# Patient Record
Sex: Male | Born: 1980 | Race: Black or African American | Hispanic: No | Marital: Single | Smoking: Current every day smoker
Health system: Southern US, Community
[De-identification: ages and names within clinical notes are randomized; demographics above are authoritative.]

## PROBLEM LIST (undated history)

## (undated) DIAGNOSIS — F209 Schizophrenia, unspecified: Secondary | ICD-10-CM

---

## 2004-05-20 ENCOUNTER — Emergency Department (HOSPITAL_COMMUNITY): Admission: EM | Admit: 2004-05-20 | Discharge: 2004-05-20 | Payer: Self-pay | Admitting: Emergency Medicine

## 2004-08-25 ENCOUNTER — Emergency Department (HOSPITAL_COMMUNITY): Admission: EM | Admit: 2004-08-25 | Discharge: 2004-08-25 | Payer: Self-pay | Admitting: Emergency Medicine

## 2005-03-19 ENCOUNTER — Inpatient Hospital Stay (HOSPITAL_COMMUNITY): Admission: EM | Admit: 2005-03-19 | Discharge: 2005-03-20 | Payer: Self-pay | Admitting: Emergency Medicine

## 2007-11-21 ENCOUNTER — Emergency Department (HOSPITAL_COMMUNITY): Admission: EM | Admit: 2007-11-21 | Discharge: 2007-11-21 | Payer: Self-pay | Admitting: Emergency Medicine

## 2009-08-26 ENCOUNTER — Emergency Department (HOSPITAL_COMMUNITY): Admission: EM | Admit: 2009-08-26 | Discharge: 2009-08-26 | Payer: Self-pay | Admitting: Emergency Medicine

## 2009-08-26 ENCOUNTER — Ambulatory Visit: Payer: Self-pay | Admitting: Psychiatry

## 2009-08-26 ENCOUNTER — Inpatient Hospital Stay (HOSPITAL_COMMUNITY): Admission: EM | Admit: 2009-08-26 | Discharge: 2009-08-30 | Payer: Self-pay | Admitting: Psychiatry

## 2009-08-28 ENCOUNTER — Emergency Department (HOSPITAL_COMMUNITY): Admission: EM | Admit: 2009-08-28 | Discharge: 2009-08-29 | Payer: Self-pay | Admitting: Emergency Medicine

## 2009-09-16 ENCOUNTER — Ambulatory Visit: Payer: Self-pay | Admitting: Psychiatry

## 2009-09-16 ENCOUNTER — Inpatient Hospital Stay (HOSPITAL_COMMUNITY): Admission: RE | Admit: 2009-09-16 | Discharge: 2009-09-19 | Payer: Self-pay | Admitting: Psychiatry

## 2010-08-18 LAB — THC (MARIJUANA), URINE, CONFIRMATION: Marijuana, Ur-Confirmation: 390 NG/ML — ABNORMAL HIGH

## 2010-08-18 LAB — URINE DRUGS OF ABUSE SCREEN W ALC, ROUTINE (REF LAB)
Amphetamine Screen, Ur: NEGATIVE
Barbiturate Quant, Ur: NEGATIVE
Benzodiazepines.: NEGATIVE
Cocaine Metabolites: POSITIVE — AB
Creatinine,U: 152.6 mg/dL
Ethyl Alcohol: <10 mg/dL (ref <10)
Marijuana Metabolite: POSITIVE — AB
Methadone: NEGATIVE
Opiate Screen, Urine: NEGATIVE
Phencyclidine (PCP): NEGATIVE
Propoxyphene: NEGATIVE

## 2010-08-18 LAB — COCAINE, URINE, CONFIRMATION: Benzoylecgonine GC/MS Conf: 903 NG/ML — ABNORMAL HIGH

## 2010-08-23 LAB — RAPID URINE DRUG SCREEN, HOSP PERFORMED
Amphetamines: NOT DETECTED
Barbiturates: NOT DETECTED
Benzodiazepines: NOT DETECTED
Cocaine: POSITIVE — AB
Opiates: NOT DETECTED
Tetrahydrocannabinol: POSITIVE — AB

## 2010-08-23 LAB — URINALYSIS, ROUTINE W REFLEX MICROSCOPIC
Bilirubin Urine: NEGATIVE
Glucose, UA: NEGATIVE mg/dL
Hgb urine dipstick: NEGATIVE
Ketones, ur: NEGATIVE mg/dL
Nitrite: NEGATIVE
Protein, ur: NEGATIVE mg/dL
Specific Gravity, Urine: 1.026 (ref 1.005–1.030)
Urobilinogen, UA: 1 mg/dL (ref 0.0–1.0)
pH: 7 (ref 5.0–8.0)

## 2010-08-23 LAB — BASIC METABOLIC PANEL
BUN: 11 mg/dL (ref 6–23)
CO2: 29 mEq/L (ref 19–32)
Calcium: 9.4 mg/dL (ref 8.4–10.5)
Chloride: 106 mEq/L (ref 96–112)
Creatinine, Ser: 1.27 mg/dL (ref 0.4–1.5)
GFR calc Af Amer: >60 mL/min (ref >60)
GFR calc non Af Amer: >60 mL/min (ref >60)
Glucose, Bld: 72 mg/dL (ref 70–99)
Potassium: 4.3 mEq/L (ref 3.5–5.1)
Sodium: 140 mEq/L (ref 135–145)

## 2010-08-23 LAB — CBC
HCT: 47.9 % (ref 39.0–52.0)
Hemoglobin: 16.1 g/dL (ref 13.0–17.0)
MCHC: 33.5 g/dL (ref 30.0–36.0)
MCV: 91.3 fL (ref 78.0–100.0)
Platelets: 254 10*3/uL (ref 150–400)
RBC: 5.24 MIL/uL (ref 4.22–5.81)
RDW: 13.6 % (ref 11.5–15.5)
WBC: 9.8 10*3/uL (ref 4.0–10.5)

## 2010-08-23 LAB — ETHANOL: Alcohol, Ethyl (B): <5 mg/dL (ref 0–10)

## 2010-08-23 LAB — ACETAMINOPHEN LEVEL: Acetaminophen (Tylenol), Serum: <10 ug/mL — ABNORMAL LOW (ref 10–30)

## 2010-08-23 LAB — SALICYLATE LEVEL: Salicylate Lvl: <4 mg/dL (ref 2.8–20.0)
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
## 2012-10-19 ENCOUNTER — Emergency Department (HOSPITAL_COMMUNITY)
Admission: EM | Admit: 2012-10-19 | Discharge: 2012-10-19 | Disposition: A | Discharge status: Home / Self Care | Payer: Medicare Other | Attending: Emergency Medicine | Admitting: Emergency Medicine

## 2012-10-19 ENCOUNTER — Emergency Department (HOSPITAL_COMMUNITY): Payer: Medicare Other

## 2012-10-19 ENCOUNTER — Encounter (HOSPITAL_COMMUNITY): Payer: Self-pay | Admitting: Emergency Medicine

## 2012-10-19 DIAGNOSIS — N453 Epididymo-orchitis: Secondary | ICD-10-CM | POA: Insufficient documentation

## 2012-10-19 DIAGNOSIS — N451 Epididymitis: Secondary | ICD-10-CM

## 2012-10-19 LAB — URINE MICROSCOPIC-ADD ON

## 2012-10-19 LAB — URINALYSIS, ROUTINE W REFLEX MICROSCOPIC
Glucose, UA: NEGATIVE mg/dL
Hgb urine dipstick: NEGATIVE
Ketones, ur: NEGATIVE mg/dL
Nitrite: NEGATIVE
Protein, ur: 30 mg/dL — AB
Specific Gravity, Urine: 1.031 — ABNORMAL HIGH (ref 1.005–1.030)
Urobilinogen, UA: 1 mg/dL (ref 0.0–1.0)
pH: 5.5 (ref 5.0–8.0)

## 2012-10-19 MED ORDER — DEXTROSE 5 % IV SOLN: 250.0000 mg | Freq: Once | INTRAVENOUS | Status: DC

## 2012-10-19 MED ORDER — OXYCODONE-ACETAMINOPHEN 5-325 MG PO TABS
1.0000 | ORAL_TABLET | Freq: Once | ORAL | Status: AC
  Administered 2012-10-19: 1 via ORAL
  Filled 2012-10-19: qty 1

## 2012-10-19 MED ORDER — LIDOCAINE HCL 1 % IJ SOLN
INTRAMUSCULAR | Status: AC
  Administered 2012-10-19: 20 mL
  Filled 2012-10-19: qty 20

## 2012-10-19 MED ORDER — HYDROCODONE-ACETAMINOPHEN 5-325 MG PO TABS: 1.0000 | ORAL_TABLET | ORAL | Status: DC | PRN

## 2012-10-19 MED ORDER — DOXYCYCLINE HYCLATE 100 MG PO CAPS: 100.0000 mg | ORAL_CAPSULE | Freq: Two times a day (BID) | ORAL | Status: DC

## 2012-10-19 MED ORDER — CEFTRIAXONE SODIUM 250 MG IJ SOLR
250.0000 mg | Freq: Once | INTRAMUSCULAR | Status: AC
  Administered 2012-10-19: 250 mg via INTRAMUSCULAR
  Filled 2012-10-19: qty 250

## 2012-10-19 MED ORDER — DOXYCYCLINE HYCLATE 100 MG PO TABS
100.0000 mg | ORAL_TABLET | Freq: Once | ORAL | Status: AC
  Administered 2012-10-19: 100 mg via ORAL
  Filled 2012-10-19: qty 1

## 2012-10-19 NOTE — ED Provider Notes (Signed)
History     CSN: [id number]  Arrival date & time 10/19/12  1807   First MD Initiated Contact with Patient 10/19/12 1823      Chief Complaint  Patient presents with  . Testicle Pain    (Consider location/radiation/quality/duration/timing/severity/associated sxs/prior treatment) HPI Patient presents emergency department with left testicle pain.  Patient, states the pain started early this morning.  Patient, states, that he does not have any urethral discharge, fever, nausea, vomiting, abdominal pain, back pain, dysuria, or weakness.  Patient, states, that he did not take any medications prior to arrival for his symptoms.  Patient denies any blood in his semen.    No past medical history on file.  No past surgical history on file.  No family history on file.  History  Substance Use Topics  . Smoking status: Not on file  . Smokeless tobacco: Not on file  . Alcohol Use: Not on file      Review of Systems All other systems negative except as documented in the HPI. All pertinent positives and negatives as reviewed in the HPI. Allergies  Cogentin and Risperidone and related  Home Medications   Current Outpatient Rx  Name  Route  Sig  Dispense  Refill  . OLANZapine (ZYPREXA) 20 MG tablet   Oral   Take 20 mg by mouth at bedtime.           BP 92/43  Pulse 99  Temp(Src) 98.6 F (37 C) (Oral)  Resp 16  Ht 5\' 10"  (1.778 m)  Wt 170 lb (77.111 kg)  BMI 24.39 kg/m2  SpO2 99%  Physical Exam  Nursing note and vitals reviewed. Constitutional: He is oriented to person, place, and time. He appears well-developed and well-nourished. No distress.  HENT:  Head: Normocephalic and atraumatic.  Eyes: Pupils are equal, round, and reactive to light.  Cardiovascular: Normal rate and regular rhythm.   Pulmonary/Chest: Effort normal and breath sounds normal.  Abdominal: Soft. Bowel sounds are normal. He exhibits no distension. There is no tenderness. There is no guarding. Hernia  confirmed negative in the right inguinal area and confirmed negative in the left inguinal area.  Genitourinary: Penis normal.    Right testis shows no mass, no swelling and no tenderness. Right testis is descended. Left testis shows swelling and tenderness. Left testis shows no mass. Left testis is descended. Cremasteric reflex is not absent on the left side. Circumcised. No penile tenderness. No discharge found.  Lymphadenopathy:       Right: No inguinal adenopathy present.       Left: No inguinal adenopathy present.  Neurological: He is alert and oriented to person, place, and time.  Skin: Skin is warm and dry. No rash noted.    ED Course  Procedures (including critical care time)  Labs Reviewed  URINALYSIS, ROUTINE W REFLEX MICROSCOPIC   patient is awaiting ultrasound of the scrotum with arteriovenous flow   MDM         Carlyle Dolly, PA-C 10/19/12 1953

## 2012-10-19 NOTE — ED Notes (Addendum)
C/o pain to testicles since this am when he woke up.  States the "soreness" has worsened over the day.  States the pain is now in his abdomen as well.  States he had yellowish ejaculate yesterday and 2 days ago but otherwise no penile d/c.    Denies dysuria, fevers, or n/v.

## 2012-10-19 NOTE — ED Notes (Signed)
Patient transported to Ultrasound 

## 2012-10-19 NOTE — ED Provider Notes (Signed)
Pt received from [place], PA-C.  Pt presented to ED w/ L testicular pain.  US scrotum shows epididymitis.  Results discussed w/ patient.  Gc/Chlam culture obtained.  Pt received IM rocephin and first dose of doxy in ED and d/c'd home w/ doxy and vicodin.  Referred to urology.  Recommended jock strap and avoidance of heavy lifting.  Return precautions discussed. 8:59 PM   Otilio Miu, PA-C 10/19/12 2059

## 2012-10-20 LAB — GC/CHLAMYDIA PROBE AMP
CT Probe RNA: NEGATIVE
GC Probe RNA: NEGATIVE

## 2012-10-21 NOTE — ED Provider Notes (Signed)
Medical screening examination/treatment/procedure(s) were performed by non-physician practitioner and as supervising physician I was immediately available for consultation/collaboration.    Nelia Shi, MD 10/21/12 [phone]

## 2013-01-24 ENCOUNTER — Emergency Department (HOSPITAL_COMMUNITY): Payer: Medicare Other

## 2013-01-24 ENCOUNTER — Emergency Department (HOSPITAL_COMMUNITY)
Admission: EM | Admit: 2013-01-24 | Discharge: 2013-01-24 | Disposition: A | Discharge status: Home / Self Care | Payer: Medicare Other | Attending: Emergency Medicine | Admitting: Emergency Medicine

## 2013-01-24 ENCOUNTER — Encounter (HOSPITAL_COMMUNITY): Payer: Self-pay | Admitting: *Deleted

## 2013-01-24 DIAGNOSIS — S0181XA Laceration without foreign body of other part of head, initial encounter: Secondary | ICD-10-CM

## 2013-01-24 DIAGNOSIS — S022XXA Fracture of nasal bones, initial encounter for closed fracture: Secondary | ICD-10-CM | POA: Insufficient documentation

## 2013-01-24 DIAGNOSIS — S0990XA Unspecified injury of head, initial encounter: Secondary | ICD-10-CM | POA: Insufficient documentation

## 2013-01-24 DIAGNOSIS — S0180XA Unspecified open wound of other part of head, initial encounter: Secondary | ICD-10-CM | POA: Insufficient documentation

## 2013-01-24 DIAGNOSIS — H53149 Visual discomfort, unspecified: Secondary | ICD-10-CM | POA: Insufficient documentation

## 2013-01-24 DIAGNOSIS — S0993XA Unspecified injury of face, initial encounter: Secondary | ICD-10-CM | POA: Insufficient documentation

## 2013-01-24 DIAGNOSIS — S0510XA Contusion of eyeball and orbital tissues, unspecified eye, initial encounter: Secondary | ICD-10-CM | POA: Insufficient documentation

## 2013-01-24 DIAGNOSIS — H05231 Hemorrhage of right orbit: Secondary | ICD-10-CM

## 2013-01-24 DIAGNOSIS — Z23 Encounter for immunization: Secondary | ICD-10-CM | POA: Insufficient documentation

## 2013-01-24 DIAGNOSIS — Z79899 Other long term (current) drug therapy: Secondary | ICD-10-CM | POA: Insufficient documentation

## 2013-01-24 MED ORDER — TETANUS-DIPHTH-ACELL PERTUSSIS 5-2.5-18.5 LF-MCG/0.5 IM SUSP
0.5000 mL | Freq: Once | INTRAMUSCULAR | Status: AC
  Administered 2013-01-24: 0.5 mL via INTRAMUSCULAR
  Filled 2013-01-24: qty 0.5

## 2013-01-24 MED ORDER — ONDANSETRON 4 MG PO TBDP
4.0000 mg | ORAL_TABLET | Freq: Once | ORAL | Status: AC
  Administered 2013-01-24: 4 mg via ORAL
  Filled 2013-01-24: qty 1

## 2013-01-24 MED ORDER — OXYCODONE-ACETAMINOPHEN 5-325 MG PO TABS
1.0000 | ORAL_TABLET | Freq: Once | ORAL | Status: AC
  Administered 2013-01-24: 1 via ORAL
  Filled 2013-01-24: qty 1

## 2013-01-24 MED ORDER — ONDANSETRON 4 MG PO TBDP: 4.0000 mg | ORAL_TABLET | Freq: Three times a day (TID) | ORAL | Status: DC | PRN

## 2013-01-24 MED ORDER — HYDROCODONE-ACETAMINOPHEN 5-325 MG PO TABS: 1.0000 | ORAL_TABLET | Freq: Four times a day (QID) | ORAL | Status: DC | PRN
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
## 2013-01-26 NOTE — ED Provider Notes (Signed)
Medical screening examination/treatment/procedure(s) were performed by non-physician practitioner and as supervising physician I was immediately available for consultation/collaboration.  Shon Baton, MD 01/26/13 [phone]

## 2013-02-01 ENCOUNTER — Encounter (HOSPITAL_COMMUNITY): Payer: Self-pay | Admitting: Emergency Medicine

## 2013-02-01 ENCOUNTER — Emergency Department (HOSPITAL_COMMUNITY)
Admission: EM | Admit: 2013-02-01 | Discharge: 2013-02-01 | Disposition: A | Discharge status: Home / Self Care | Payer: Medicare Other | Attending: Emergency Medicine | Admitting: Emergency Medicine

## 2013-02-01 DIAGNOSIS — Z4802 Encounter for removal of sutures: Secondary | ICD-10-CM

## 2013-02-01 DIAGNOSIS — H1130 Conjunctival hemorrhage, unspecified eye: Secondary | ICD-10-CM | POA: Insufficient documentation

## 2013-02-01 NOTE — ED Notes (Signed)
Pt here to have sutures removed from forehead.
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
## 2013-02-02 NOTE — ED Provider Notes (Signed)
Medical screening examination/treatment/procedure(s) were performed by non-physician practitioner and as supervising physician I was immediately available for consultation/collaboration.  Sarin Comunale L Yonathan Perrow, MD 02/02/13 0038 

## 2013-06-18 ENCOUNTER — Emergency Department (HOSPITAL_COMMUNITY)
Admission: EM | Admit: 2013-06-18 | Discharge: 2013-06-18 | Discharge status: Against Medical Advice | Payer: Medicare Other

## 2013-06-18 ENCOUNTER — Emergency Department (HOSPITAL_COMMUNITY)
Admission: EM | Admit: 2013-06-18 | Discharge: 2013-06-18 | Disposition: A | Discharge status: Home / Self Care | Payer: Medicare Other | Attending: Emergency Medicine | Admitting: Emergency Medicine

## 2013-06-18 ENCOUNTER — Encounter (HOSPITAL_COMMUNITY): Payer: Self-pay | Admitting: Emergency Medicine

## 2013-06-18 DIAGNOSIS — R509 Fever, unspecified: Secondary | ICD-10-CM | POA: Insufficient documentation

## 2013-06-18 DIAGNOSIS — Z8659 Personal history of other mental and behavioral disorders: Secondary | ICD-10-CM | POA: Insufficient documentation

## 2013-06-18 DIAGNOSIS — F17200 Nicotine dependence, unspecified, uncomplicated: Secondary | ICD-10-CM | POA: Insufficient documentation

## 2013-06-18 DIAGNOSIS — K047 Periapical abscess without sinus: Secondary | ICD-10-CM

## 2013-06-18 DIAGNOSIS — K044 Acute apical periodontitis of pulpal origin: Secondary | ICD-10-CM | POA: Insufficient documentation

## 2013-06-18 DIAGNOSIS — R221 Localized swelling, mass and lump, neck: Secondary | ICD-10-CM

## 2013-06-18 DIAGNOSIS — J029 Acute pharyngitis, unspecified: Secondary | ICD-10-CM | POA: Insufficient documentation

## 2013-06-18 DIAGNOSIS — R220 Localized swelling, mass and lump, head: Secondary | ICD-10-CM | POA: Insufficient documentation

## 2013-06-18 DIAGNOSIS — K029 Dental caries, unspecified: Secondary | ICD-10-CM | POA: Insufficient documentation

## 2013-06-18 HISTORY — DX: Schizophrenia, unspecified: F20.9

## 2013-06-18 MED ORDER — PENICILLIN V POTASSIUM 500 MG PO TABS: 500.0000 mg | ORAL_TABLET | Freq: Four times a day (QID) | ORAL | Status: DC

## 2013-06-18 MED ORDER — IBUPROFEN 800 MG PO TABS
800.0000 mg | ORAL_TABLET | Freq: Once | ORAL | Status: AC
  Administered 2013-06-18: 800 mg via ORAL
  Filled 2013-06-18: qty 1

## 2013-06-18 MED ORDER — IBUPROFEN 800 MG PO TABS: 800.0000 mg | ORAL_TABLET | Freq: Three times a day (TID) | ORAL | Status: DC | PRN

## 2013-06-18 MED ORDER — HYDROCODONE-ACETAMINOPHEN 5-325 MG PO TABS
1.0000 | ORAL_TABLET | Freq: Once | ORAL | Status: AC
  Administered 2013-06-18: 1 via ORAL
  Filled 2013-06-18: qty 1

## 2013-06-18 MED ORDER — HYDROCODONE-ACETAMINOPHEN 5-325 MG PO TABS: 1.0000 | ORAL_TABLET | ORAL | Status: AC | PRN

## 2013-06-18 NOTE — ED Notes (Signed)
Pt presents with NAD. Pt c/o left rear dental pain x 12 hours.

## 2013-06-18 NOTE — ED Provider Notes (Signed)
CSN: [id number]     Arrival date & time 06/18/13  1408 History  This chart was scribed for non-physician practitioner, Trixie DredgeEmily Meklit Cotta, PA-C working with Juliet RudeNathan R. Rubin PayorPickering, MD by Greggory StallionKayla Andersen, ED scribe. This patient was seen in room WTR9/WTR9 and the patient's care was started at 3:16 PM.   Chief Complaint  Patient presents with  . Dental Pain   The history is provided by the patient. No language interpreter was used.   HPI Comments: Jeffrey Merritt is a [age] y.o. male who presents to the Emergency Department complaining of gradual onset, constant left dental pain with associated swelling that started 12 hours ago. Pt has subjective fever, sore throat and trouble swallowing. Denies trouble breathing.   Past Medical History  Diagnosis Date  . Schizophrenia    History reviewed. No pertinent past surgical history. No family history on file. History  Substance Use Topics  . Smoking status: Current Every Day Smoker -- 1.00 packs/day    Types: Cigarettes  . Smokeless tobacco: Not on file  . Alcohol Use: No    Review of Systems  Constitutional: Negative for fever.  HENT: Positive for dental problem, facial swelling, sore throat and trouble swallowing.   All other systems reviewed and are negative.    Allergies  Cogentin and Risperidone and related  Home Medications   Current Outpatient Rx  Name  Route  Sig  Dispense  Refill  . HYDROcodone-acetaminophen (NORCO/VICODIN) 5-325 MG per tablet   Oral   Take 1-2 tablets by mouth every 6 (six) hours as needed for pain.   12 tablet   0   . ondansetron (ZOFRAN ODT) 4 MG disintegrating tablet   Oral   Take 1 tablet (4 mg total) by mouth every 8 (eight) hours as needed for nausea.   20 tablet   0    BP 152/93  Pulse 64  Temp(Src) 99.2 F (37.3 C) (Oral)  Resp 18  Ht 5\' 10"  (1.778 m)  Wt 165 lb (74.844 kg)  BMI 23.68 kg/m2  SpO2 98%  Physical Exam  Nursing note and vitals reviewed. Constitutional: He appears well-developed  and well-nourished. No distress.  HENT:  Head: Normocephalic and atraumatic.  Mouth/Throat: Uvula is midline and oropharynx is clear and moist. Mucous membranes are not dry. No trismus in the jaw. Dental caries present. No uvula swelling. No oropharyngeal exudate, posterior oropharyngeal edema, posterior oropharyngeal erythema or tonsillar abscesses.  Left lower second and third molar decayed down to the gumline. Gingiva erythematous and edematous.  Neck: Normal range of motion. Neck supple.  No paratracheal tenderness.   Pulmonary/Chest: Effort normal. No stridor.  Lymphadenopathy:    He has no cervical adenopathy.  Submandibular lymph node on the left is tender.   Neurological: He is alert.  Skin: He is not diaphoretic.    ED Course  Procedures (including critical care time)  DIAGNOSTIC STUDIES: Oxygen Saturation is 98% on RA, normal by my interpretation.    COORDINATION OF CARE: 3:18 PM-Discussed treatment plan which includes pain medication and an antibiotic with pt at bedside and pt agreed to plan.   Labs Review Labs Reviewed - No data to display Imaging Review No results found.  EKG Interpretation   None      Filed Vitals:   06/18/13 1546  BP: 132/74  Pulse: 75  Temp:   Resp: 18     MDM   1. Dental infection   2. Dental decay    Afebrile nontoxic patient with dental  pain and adjacent gingival swelling.  Possible abscess.  No airway concerns.  D/C home with dental resources and referral, ibuprofen, norco, penicillin.  Discussed  findings, treatment, and follow up  with patient.  Pt given return precautions.  Pt verbalizes understanding and agrees with plan.      I doubt any other EMC precluding discharge at this time including, but not necessarily limited to the following: ludwig's angina, deep space head or neck infection.   I personally performed the services described in this documentation, which was scribed in my presence. The recorded information has been  reviewed and is accurate.   Trixie Dredge, PA-C 06/18/13 [phone]

## 2013-06-18 NOTE — ED Notes (Signed)
PA at bedside.

## 2013-06-18 NOTE — ED Provider Notes (Signed)
Medical screening examination/treatment/procedure(s) were performed by non-physician practitioner and as supervising physician I was immediately available for consultation/collaboration.  EKG Interpretation   None        Storie Heffern R. Tieasha Larsen, MD 06/18/13 2348 

## 2013-06-18 NOTE — Discharge Instructions (Signed)
Read the information below.  Use the prescribed medication as directed.  Please discuss all new medications with your pharmacist.  Do not take additional tylenol while taking the prescribed pain medication to avoid overdose.  You may return to the Emergency Department at any time for worsening condition or any new symptoms that concern you.  Please call the dentist listed above within 48 hours to schedule a close follow up appointment.  If you develop fevers, swelling in your face, difficulty swallowing or breathing, return to the ER immediately for a recheck.   Dental Abscess A dental abscess is a collection of infected fluid (pus) from a bacterial infection in the inner part of the tooth (pulp). It usually occurs at the end of the tooth's root.  CAUSES   Severe tooth decay.  Trauma to the tooth that allows bacteria to enter into the pulp, such as a broken or chipped tooth. SYMPTOMS   Severe pain in and around the infected tooth.  Swelling and redness around the abscessed tooth or in the mouth or face.  Tenderness.  Pus drainage.  Bad breath.  Bitter taste in the mouth.  Difficulty swallowing.  Difficulty opening the mouth.  Nausea.  Vomiting.  Chills.  Swollen neck glands. DIAGNOSIS   A medical and dental history will be taken.  An examination will be performed by tapping on the abscessed tooth.  X-rays may be taken of the tooth to identify the abscess. TREATMENT The goal of treatment is to eliminate the infection. You may be prescribed antibiotic medicine to stop the infection from spreading. A root canal may be performed to save the tooth. If the tooth cannot be saved, it may be pulled (extracted) and the abscess may be drained.  HOME CARE INSTRUCTIONS  Only take over-the-counter or prescription medicines for pain, fever, or discomfort as directed by your caregiver.  Rinse your mouth (gargle) often with salt water ( tsp salt in 8 oz [250 ml] of warm water) to  relieve pain or swelling.  Do not drive after taking pain medicine (narcotics).  Do not apply heat to the outside of your face.  Return to your dentist for further treatment as directed. SEEK MEDICAL CARE IF:  Your pain is not helped by medicine.  Your pain is getting worse instead of better. SEEK IMMEDIATE MEDICAL CARE IF:  You have a fever or persistent symptoms for more than 2 3 days.  You have a fever and your symptoms suddenly get worse.  You have chills or a very bad headache.  You have problems breathing or swallowing.  You have trouble opening your mouth.  You have swelling in the neck or around the eye. Document Released: 05/17/2005 Document Revised: 02/09/2012 Document Reviewed: 08/25/2010 [facility] Patient Information 2014 [employer], [place].  Dental Pain A tooth ache may be caused by cavities (tooth decay). Cavities expose the nerve of the tooth to air and hot or cold temperatures. It may come from an infection or abscess (also called a boil or furuncle) around your tooth. It is also often caused by dental caries (tooth decay). This causes the pain you are having. DIAGNOSIS  Your caregiver can diagnose this problem by exam. TREATMENT   If caused by an infection, it may be treated with medications which kill germs (antibiotics) and pain medications as prescribed by your caregiver. Take medications as directed.  Only take over-the-counter or prescription medicines for pain, discomfort, or fever as directed by your caregiver.  Whether the tooth ache today is  caused by infection or dental disease, you should see your dentist as soon as possible for further care. SEEK MEDICAL CARE IF: The exam and treatment you received today has been provided on an emergency basis only. This is not a substitute for complete medical or dental care. If your problem worsens or new problems (symptoms) appear, and you are unable to meet with your dentist, call or return to this location. SEEK  IMMEDIATE MEDICAL CARE IF:   You have a fever.  You develop redness and swelling of your face, jaw, or neck.  You are unable to open your mouth.  You have severe pain uncontrolled by pain medicine. MAKE SURE YOU:   Understand these instructions.  Will watch your condition.  Will get help right away if you are not doing well or get worse. Document Released: 05/17/2005 Document Revised: 08/09/2011 Document Reviewed: 01/03/2008 [facility] Patient Information 2014 [place], [place].

## 2016-04-29 ENCOUNTER — Emergency Department (HOSPITAL_COMMUNITY)
Admission: EM | Admit: 2016-04-29 | Discharge: 2016-04-30 | Disposition: A | Discharge status: Home / Self Care | Payer: Medicare Other | Attending: Emergency Medicine | Admitting: Emergency Medicine

## 2016-04-29 ENCOUNTER — Encounter (HOSPITAL_COMMUNITY): Payer: Self-pay | Admitting: Emergency Medicine

## 2016-04-29 DIAGNOSIS — W228XXA Striking against or struck by other objects, initial encounter: Secondary | ICD-10-CM | POA: Insufficient documentation

## 2016-04-29 DIAGNOSIS — S61215A Laceration without foreign body of left ring finger without damage to nail, initial encounter: Secondary | ICD-10-CM | POA: Insufficient documentation

## 2016-04-29 DIAGNOSIS — Y999 Unspecified external cause status: Secondary | ICD-10-CM | POA: Insufficient documentation

## 2016-04-29 DIAGNOSIS — Y929 Unspecified place or not applicable: Secondary | ICD-10-CM | POA: Insufficient documentation

## 2016-04-29 DIAGNOSIS — Y939 Activity, unspecified: Secondary | ICD-10-CM | POA: Diagnosis not present

## 2016-04-29 DIAGNOSIS — F17210 Nicotine dependence, cigarettes, uncomplicated: Secondary | ICD-10-CM | POA: Insufficient documentation

## 2016-04-29 DIAGNOSIS — S6992XA Unspecified injury of left wrist, hand and finger(s), initial encounter: Secondary | ICD-10-CM | POA: Diagnosis present

## 2016-04-29 NOTE — ED Triage Notes (Signed)
Pt. presents with small laceration at tip of his left ring finger sustained this evening while opening his drawer .Minimal bleeding with mild pain .

## 2016-04-30 MED ORDER — LIDOCAINE HCL (PF) 1 % IJ SOLN
5.0000 mL | Freq: Once | INTRAMUSCULAR | Status: AC
  Administered 2016-04-30: 5 mL
  Filled 2016-04-30: qty 5

## 2016-04-30 MED ORDER — BACITRACIN ZINC 500 UNIT/GM EX OINT: 1.0000 "application " | TOPICAL_OINTMENT | Freq: Two times a day (BID) | CUTANEOUS | 0 refills | Status: AC

## 2016-04-30 NOTE — ED Notes (Signed)
EDP at bedside  

## 2016-04-30 NOTE — Discharge Instructions (Signed)
Keep your wound covered for at least 24 hrs. Keep it clean with warm water and soap. Apply bacitracin ointment twice a day. Follow up with your primary care provider. Return to the ER for new or worsening symptoms.

## 2016-04-30 NOTE — ED Provider Notes (Signed)
[facility]-EMERGENCY DEPT Provider Note   CSN: [id number] Arrival date & time: 04/29/16  2320   History   Chief Complaint Chief Complaint  Patient presents with  . Finger Injury     HPI Jeffrey Merritt is a 35 y.o. Male with history of schizophrenia who presents to the ED for evaluation of a laceration to his left finger. States he was reaching in his closet earlier this evening when he cut the pad of his finger on something sharp; he is not sure what. He states he cleaned it with water and covered it with a bandage. He reports pain 7/10. Denies anticoagulation use. Denies numbness or weakness. He does not want stitches tonight. States his last tetanus was within the past 1-2 years. Denies any other injury or trauma.    Past Medical History:  Diagnosis Date  . Schizophrenia (HCC)     There are no active problems to display for this patient.   History reviewed. No pertinent surgical history.    Home Medications    Prior to Admission medications   Medication Sig Start Date End Date Taking? Authorizing Provider  HYDROcodone-acetaminophen (NORCO/VICODIN) 5-325 MG per tablet Take 1 tablet by mouth every 4 (four) hours as needed for moderate pain or severe pain. 06/18/13   Trixie DredgeEmily West, PA-C  ibuprofen (ADVIL,MOTRIN) 200 MG tablet Take 200 mg by mouth every 6 (six) hours as needed (pain).    Historical Provider, MD  ibuprofen (ADVIL,MOTRIN) 800 MG tablet Take 1 tablet (800 mg total) by mouth every 8 (eight) hours as needed for mild pain or moderate pain. 06/18/13   Trixie DredgeEmily West, PA-C  penicillin v potassium (VEETID) 500 MG tablet Take 1 tablet (500 mg total) by mouth 4 (four) times daily. 06/18/13   Trixie DredgeEmily West, PA-C    Family History No family history on file.  Social History Social History  Substance Use Topics  . Smoking status: Current Every Day Smoker    Packs/day: 1.00    Types: Cigarettes  . Smokeless tobacco: Never Used  . Alcohol use No     Allergies   Cogentin  [benztropine] and Risperidone and related   Review of Systems Review of Systems 10 Systems reviewed and are negative for acute change except as noted in the HPI.  Physical Exam Updated Vital Signs BP (!) 162/109 (BP Location: Left Arm)   Pulse (!) 122   Temp 98.6 F (37 C) (Oral)   Resp 22   SpO2 99%   Physical Exam  Constitutional: He is oriented to person, place, and time. No distress.  HENT:  Head: Atraumatic.  Right Ear: External ear normal.  Left Ear: External ear normal.  Nose: Nose normal.  Eyes: Conjunctivae are normal. No scleral icterus.  Neck: Normal range of motion.  Cardiovascular: Regular rhythm and normal heart sounds.   tachycardic  Pulmonary/Chest: Effort normal and breath sounds normal. No respiratory distress. He has no wheezes. He has no rales.  Abdominal: He exhibits no distension.  Musculoskeletal:  Left distal fourth finger with v-shaped laceration on volar surface. No active bleeding. Tender. FROM of fingers with normal finger-thumb opposition  Neurological: He is alert and oriented to person, place, and time.  Skin: Skin is warm and dry. He is not diaphoretic.  Psychiatric: His speech is rapid and/or pressured. He is hyperactive.  Intense affect  Nursing note and vitals reviewed.     Vitals:   04/29/16 2325 04/30/16 0009  BP: (!) 151/109 (!) 162/109  Pulse: 117 (!) 122  Resp: 18 22  Temp: 97.7 F (36.5 C) 98.6 F (37 C)  TempSrc: Oral Oral  SpO2: 100% 99%     ED Treatments / Results  Labs (all labs ordered are listed, but only abnormal results are displayed) Labs Reviewed - No data to display  EKG  EKG Interpretation None       Radiology No results found.  Procedures .Nerve Block Date/Time: 04/30/2016 12:59 AM Performed by: Carlene CoriaSAM, Patrici Minnis Y Authorized by: Carlene CoriaSAM, Kinnley Paulson Y   Consent:    Consent obtained:  Verbal   Consent given by:  Patient   Risks discussed:  Infection and pain   Alternatives discussed:  No treatment and  alternative treatment Indications:    Indications:  Procedural anesthesia Location:    Body area:  Upper extremity   Laterality:  Left (left fourth finger) Pre-procedure details:    Skin preparation:  2% chlorhexidine   Preparation: Patient was prepped and draped in usual sterile fashion   Procedure details (see MAR for exact dosages):    Block needle gauge:  25 G   Anesthetic injected:  Lidocaine 1% w/o epi   Steroid injected:  None   Additive injected:  None   Injection procedure:  Anatomic landmarks identified, anatomic landmarks palpated, negative aspiration for blood, incremental injection and introduced needle   Paresthesia:  None Post-procedure details:    Outcome:  Anesthesia achieved   Patient tolerance of procedure:  Tolerated well, no immediate complications   (including critical care time)  Medications Ordered in ED Medications - No data to display   Initial Impression / Assessment and Plan / ED Course  I have reviewed the triage vital signs and the nursing notes.  Pertinent labs & imaging results that were available during my care of the patient were reviewed by me and considered in my medical decision making (see chart for details).  Clinical Course     Pt presenting with laceration to pad of left fourth finger sustained this evening. He is UTD on tetanus. Otherwise neurovascularly intact. He declines suture repair tonight even though I did recommend it would be preferable compared to healing by secondary intention. However he does not want to proceed with sutures. Declines imaging for foreign bodies. I did help him thoroughly irrigate the wound and dress with bacitracin and sterile bandage. No foreign bodies visible or palpable.  On re-eval pt also reports dry cough intermittently past couple of weeks. Denies fever or chills. Denies nasal congestion, shortness of breath, abdominal pain, chest pain, n/v/d. His lungs are CTAB. He is afebrile. He does not want  CXR.  Pt is noted to be tachycardic and hypertensive. He states he does not feel anxious or manic. He admits to using marijuana but denies other drug use. Denies SI, HI, auditory or visual hallucinations. He states he does not take his Depakote because he does not like feeling like a zombie. He states he thinks his heart is racing because his thoughts are racing. He states [facility] Discussed with Dr. Manus Gunningancour. We will allow pt to rest and reassess. Pt continues to deny chest pain or SOB. He is not tachypneic or hypoxic.  12:34 AM Dr. Manus Gunningancour spoke with and evaluated pt. Pt now admits to using MDMA. His tachycardia and hypertension likely 2/2 MDMA use. He is now amenable to stay for suture repair.   12:59 AM After digital block pt now again declining suture repair. He states he will just keep the area  wrapped and clean. Pt feels ready to go home. Continues do deny chest pain or SOB. Denies SI/HI/AH/VH. He does not appear to be responding to internal stimuli. His speech is clear and goal oriented with appropriate judgement though rapid and pressured. Encouraged close f/u with [facility]. ER return precautions given.  Final Clinical Impressions(s) / ED Diagnoses   Final diagnoses:  Laceration of left ring finger without foreign body without damage to nail, initial encounter    New Prescriptions New Prescriptions   BACITRACIN OINTMENT    Apply 1 application topically 2 (two) times daily.     Carlene Coria, PA-C 04/30/16 1610    Glynn Octave, MD 04/30/16 [phone]

## 2016-04-30 NOTE — ED Notes (Signed)
PA at bedside.

## 2016-05-25 ENCOUNTER — Emergency Department (HOSPITAL_COMMUNITY): Payer: Medicare Other

## 2016-05-25 ENCOUNTER — Emergency Department (HOSPITAL_COMMUNITY)
Admission: EM | Admit: 2016-05-25 | Discharge: 2016-05-25 | Disposition: A | Discharge status: Home / Self Care | Payer: Medicare Other | Attending: Emergency Medicine | Admitting: Emergency Medicine

## 2016-05-25 ENCOUNTER — Encounter (HOSPITAL_COMMUNITY): Payer: Self-pay | Admitting: Radiology

## 2016-05-25 DIAGNOSIS — Z79899 Other long term (current) drug therapy: Secondary | ICD-10-CM | POA: Diagnosis not present

## 2016-05-25 DIAGNOSIS — F17210 Nicotine dependence, cigarettes, uncomplicated: Secondary | ICD-10-CM | POA: Diagnosis not present

## 2016-05-25 DIAGNOSIS — R197 Diarrhea, unspecified: Secondary | ICD-10-CM | POA: Diagnosis not present

## 2016-05-25 DIAGNOSIS — R1012 Left upper quadrant pain: Secondary | ICD-10-CM | POA: Insufficient documentation

## 2016-05-25 LAB — COMPREHENSIVE METABOLIC PANEL
ALT: 15 U/L — AB (ref 17–63)
AST: 18 U/L (ref 15–41)
Albumin: 4.1 g/dL (ref 3.5–5.0)
Alkaline Phosphatase: 52 U/L (ref 38–126)
Anion gap: 6 (ref 5–15)
BILIRUBIN TOTAL: 0.6 mg/dL (ref 0.3–1.2)
BUN: <5 mg/dL — ABNORMAL LOW (ref 6–20)
CHLORIDE: 108 mmol/L (ref 101–111)
CO2: 23 mmol/L (ref 22–32)
CREATININE: 0.88 mg/dL (ref 0.61–1.24)
Calcium: 9.2 mg/dL (ref 8.9–10.3)
Glucose, Bld: 124 mg/dL — ABNORMAL HIGH (ref 65–99)
Potassium: 4.3 mmol/L (ref 3.5–5.1)
Sodium: 137 mmol/L (ref 135–145)
TOTAL PROTEIN: 6.8 g/dL (ref 6.5–8.1)

## 2016-05-25 LAB — CBC
HCT: 39.2 % (ref 39.0–52.0)
Hemoglobin: 13.6 g/dL (ref 13.0–17.0)
MCH: 29.2 pg (ref 26.0–34.0)
MCHC: 34.7 g/dL (ref 30.0–36.0)
MCV: 84.3 fL (ref 78.0–100.0)
PLATELETS: 281 10*3/uL (ref 150–400)
RBC: 4.65 MIL/uL (ref 4.22–5.81)
RDW: 12.9 % (ref 11.5–15.5)
WBC: 15.7 10*3/uL — AB (ref 4.0–10.5)

## 2016-05-25 LAB — URINALYSIS, ROUTINE W REFLEX MICROSCOPIC
BILIRUBIN URINE: NEGATIVE
Glucose, UA: NEGATIVE mg/dL
HGB URINE DIPSTICK: NEGATIVE
KETONES UR: NEGATIVE mg/dL
Leukocytes, UA: NEGATIVE
NITRITE: NEGATIVE
PROTEIN: NEGATIVE mg/dL
SPECIFIC GRAVITY, URINE: 1.009 (ref 1.005–1.030)
pH: 5 (ref 5.0–8.0)

## 2016-05-25 LAB — RAPID URINE DRUG SCREEN, HOSP PERFORMED
AMPHETAMINES: NOT DETECTED
BENZODIAZEPINES: NOT DETECTED
Barbiturates: NOT DETECTED
COCAINE: POSITIVE — AB
OPIATES: NOT DETECTED
Tetrahydrocannabinol: POSITIVE — AB

## 2016-05-25 LAB — LIPASE, BLOOD: LIPASE: 35 U/L (ref 11–51)

## 2016-05-25 MED ORDER — IBUPROFEN 200 MG PO TABS
600.0000 mg | ORAL_TABLET | Freq: Once | ORAL | Status: AC
  Administered 2016-05-25: 600 mg via ORAL
  Filled 2016-05-25: qty 3

## 2016-05-25 MED ORDER — ONDANSETRON 8 MG PO TBDP
8.0000 mg | ORAL_TABLET | Freq: Once | ORAL | Status: AC
  Administered 2016-05-25: 8 mg via ORAL
  Filled 2016-05-25: qty 1

## 2016-05-25 MED ORDER — FAMOTIDINE 20 MG PO TABS
20.0000 mg | ORAL_TABLET | Freq: Once | ORAL | Status: AC
  Administered 2016-05-25: 20 mg via ORAL
  Filled 2016-05-25: qty 1

## 2016-05-25 NOTE — Discharge Instructions (Signed)
[facility]   Get help right away if: Your pain does not go away as soon as your health care provider told you to expect. You cannot stop throwing up. Your pain is only in areas of the abdomen, such as the right side or the left lower portion of the abdomen. You have bloody or black stools, or stools that look like tar. You have severe pain, cramping, or bloating in your abdomen. You have signs of dehydration, such as: Dark urine, very little urine, or no urine. Cracked lips. Dry mouth. Sunken eyes. Sleepiness. Weakness.

## 2016-05-25 NOTE — ED Notes (Signed)
Per EMS- Pt has had abd pain and diarrhea for past 6 hours. 10/10 generalized abd pain.

## 2016-05-25 NOTE — ED Notes (Signed)
Pt is aware that a urine sample Is needed and is attempting to get one at this time.

## 2016-05-25 NOTE — ED Notes (Signed)
Bed: [id number] Expected date:  Expected time:  Means of arrival:  Comments: 35 yo M/ diarrhea

## 2016-05-25 NOTE — ED Provider Notes (Signed)
[facility]-EMERGENCY DEPT Provider Note   CSN: [id number] Arrival date & time: 05/25/16  [id number]     History   Chief Complaint Chief Complaint  Patient presents with  . Abdominal Pain  . Diarrhea    HPI Jeffrey Merritt is a 35 y.o. male with history of schizophrenia presenting with worsening left upper quadrant pain and diarrhea for 3 days. Patient states that the pain he is experiencing in his left upper quadrant pain feels the pressure, localized, and 10/10. He states that is that I feel like this before. Patient reports about 10 episodes of diarrhea within the last 3 days. Denies any blood in diarrhea. Patient admits to appetite change and chills. Patient reports associated shortness of breath secondary to pain. Patient denies trauma, chest pain, back pain, vomiting, urinary symptoms, changes in gait, focal neurological deficits, any vision changes.  HPI  Past Medical History:  Diagnosis Date  . Schizophrenia (HCC)     There are no active problems to display for this patient.   No past surgical history on file.     Home Medications    Prior to Admission medications   Medication Sig Start Date End Date Taking? Authorizing Provider  bacitracin ointment Apply 1 application topically 2 (two) times daily. 04/30/16  Yes Ace GinsSerena Y Sam, PA-C  HYDROcodone-acetaminophen (NORCO/VICODIN) 5-325 MG per tablet Take 1 tablet by mouth every 4 (four) hours as needed for moderate pain or severe pain. 06/18/13  Yes Trixie DredgeEmily West, PA-C  ibuprofen (ADVIL,MOTRIN) 800 MG tablet Take 1 tablet (800 mg total) by mouth every 8 (eight) hours as needed for mild pain or moderate pain. Patient not taking: Reported on 05/25/2016 06/18/13   Trixie DredgeEmily West, PA-C  penicillin v potassium (VEETID) 500 MG tablet Take 1 tablet (500 mg total) by mouth 4 (four) times daily. Patient not taking: Reported on 05/25/2016 06/18/13   Trixie DredgeEmily West, PA-C    Family History No family history on file.  Social History Social History    Substance Use Topics  . Smoking status: Current Every Day Smoker    Packs/day: 1.00    Types: Cigarettes  . Smokeless tobacco: Never Used  . Alcohol use No     Allergies   Cogentin [benztropine] and Risperidone and related   Review of Systems Review of Systems  Constitutional: Positive for appetite change and chills. Negative for fever.  HENT: Negative for trouble swallowing.   Eyes: Negative for visual disturbance.  Respiratory: Positive for shortness of breath. Negative for cough and wheezing.   Cardiovascular: Negative for chest pain and leg swelling.  Gastrointestinal: Positive for abdominal pain (LUQ) and diarrhea. Negative for vomiting.  Genitourinary: Negative for difficulty urinating and dysuria.  Musculoskeletal: Negative for back pain, neck pain and neck stiffness.  Skin: Negative for rash and wound.  Neurological: Negative for speech difficulty.  Psychiatric/Behavioral: Negative for agitation.     Physical Exam Updated Vital Signs BP 111/66   Pulse 96   Temp 97.4 F (36.3 C) (Oral)   Resp 16   SpO2 98%   Physical Exam  Constitutional: He is oriented to person, place, and time. He appears well-developed and well-nourished.  HENT:  Head: Normocephalic and atraumatic.  Nose: Nose normal.  Mouth/Throat: Oropharynx is clear and moist.  Eyes: Conjunctivae and EOM are normal. Pupils are equal, round, and reactive to light.  Neck: Normal range of motion. Neck supple.  Cardiovascular: Normal rate and normal heart sounds.   Pulmonary/Chest: Effort normal and breath sounds normal. No respiratory  distress. He has no wheezes. He has no rales. He exhibits no tenderness.  Abdominal: There is tenderness. There is guarding. There is no rebound. No hernia.  Musculoskeletal: Normal range of motion.  Neurological: He is alert and oriented to person, place, and time.  Skin: Skin is warm. Capillary refill takes less than 2 seconds. No rash noted.  Psychiatric: He has a  normal mood and affect. His behavior is normal.  Nursing note and vitals reviewed.    ED Treatments / Results  Labs (all labs ordered are listed, but only abnormal results are displayed) Labs Reviewed  COMPREHENSIVE METABOLIC PANEL - Abnormal; Notable for the following:       Result Value   Glucose, Bld 124 (*)    BUN <5 (*)    ALT 15 (*)    All other components within normal limits  URINALYSIS, ROUTINE W REFLEX MICROSCOPIC - Abnormal; Notable for the following:    Color, Urine STRAW (*)    All other components within normal limits  CBC - Abnormal; Notable for the following:    WBC 15.7 (*)    All other components within normal limits  RAPID URINE DRUG SCREEN, HOSP PERFORMED - Abnormal; Notable for the following:    Cocaine POSITIVE (*)    Tetrahydrocannabinol POSITIVE (*)    All other components within normal limits  LIPASE, BLOOD    EKG  EKG Interpretation None       Radiology Ct Renal Stone Study  Result Date: 05/25/2016 CLINICAL DATA:  Abdominal pain and diarrhea EXAM: CT ABDOMEN AND PELVIS WITHOUT CONTRAST TECHNIQUE: Multidetector CT imaging of the abdomen and pelvis was performed following the standard protocol without or oral or intravenous contrast material administration. COMPARISON:  None. FINDINGS: Lower chest: Lung bases are clear. Hepatobiliary: Liver measures 19.7 cm in length. No focal liver lesions are evident on this noncontrast enhanced study. Gallbladder is somewhat contracted. There is no biliary duct dilatation. Pancreas: There is no pancreatic mass or inflammatory focus. Spleen: No splenic lesions are evident. Adrenals/Urinary Tract: Adrenals appear unremarkable edematous, however. There is no evident renal or ureteral calculus on either side. The urinary bladder is midline with wall thickness within normal limits. Several phleboliths are near but felt to be separate from the distal ureters. At that Stomach/Bowel: There is no bowel wall or mesenteric  thickening. Stomach is distended with fluid and air. There is no evident bowel obstruction. No free air or portal venous air. Vascular/Lymphatic: There is no abdominal aortic aneurysm. A small amount of vascular calcification is noted in the aorta. Major mesenteric vessels appear patent on this noncontrast enhanced study. There is no evident adenopathy in the abdomen or pelvis. Reproductive: Prostate and seminal vesicles appear normal in size and contour. There is no pelvic mass or pelvic fluid collection. Other: Appendix appears unremarkable. There is no ascites or abscess in the abdomen or pelvis. Musculoskeletal: There are no blastic or lytic bone lesions. There is no intramuscular or abdominal wall lesion. IMPRESSION: No renal or ureteral calculus. No hydronephrosis. Right kidney appears subtly edematous without perinephric stranding or thickening. No abscess evident. Question recent calculus passage on the right. Early pyelonephritis on the right could present in this manner and is a differential consideration. Stomach distended with fluid and air. No bowel obstruction. No abscess. Appendix appears normal. Liver prominent without focal lesion evident. Electronically Signed   By: Bretta BangWilliam  Woodruff III M.D.   On: 05/25/2016 09:00    Procedures Procedures (including critical care time)  Medications Ordered in ED Medications  ibuprofen (ADVIL,MOTRIN) tablet 600 mg (600 mg Oral Given 05/25/16 0706)  famotidine (PEPCID) tablet 20 mg (20 mg Oral Given 05/25/16 0957)  ondansetron (ZOFRAN-ODT) disintegrating tablet 8 mg (8 mg Oral Given 05/25/16 0957)     Initial Impression / Assessment and Plan / ED Course  I have reviewed the triage vital signs and the nursing notes.  Pertinent labs & imaging results that were available during my care of the patient were reviewed by me and considered in my medical decision making (see chart for details).  Clinical Course   Patient is a 35 year old male presenting  with left upper quadrant pain. Patient has a history of schizophrenia. Upon evaluation patient able to sit up and lay down and turn in a direction without obvious difficulty or strain. Although, Patient did state that he was in pain. Patient is afebrile, hemodynamically stable, nontoxic, nonseptic appearing. Patient had tenderness and guarding in the left upper quadrant. Heart and lung sounds clear.  WBC's increased to 15.7. Drug screen positive for cocaine and THC. EKG shows no acute findings and sinus rhythm. Patient has otherwise normal lab findings. CT renal stone study shows no acute process. On repeat exam patient does not have a surgical abdomen and there are no peritoneal signs.  No focal tenderness at McBurney's point and negative Murphy's sign. No indication of appendicitis, bowel obstruction, bowel perforation, cholecystitis, diverticulitis. Patient may have a viral gastroenteritis. Patient afebrile, hemodynamically stable, and in no apparent distress. Patient discharged home with symptomatic treatment and given strict instructions for follow-up with their primary care physician. I have also discussed reasons to return immediately to the ER. Patient expresses understanding and agrees with plan.  Patient also seen and evaluated by Dr. Donnald Garre.    Final Clinical Impressions(s) / ED Diagnoses   Final diagnoses:  Left upper quadrant pain    New Prescriptions Discharge Medication List as of 05/25/2016 10:32 AM       [address] [place], [place] 05/25/16 1614    Arby Barrette, MD 05/26/16 [phone]

## 2019-09-21 ENCOUNTER — Encounter (HOSPITAL_COMMUNITY): Payer: Self-pay

## 2019-09-21 ENCOUNTER — Other Ambulatory Visit: Payer: Self-pay

## 2019-09-21 ENCOUNTER — Emergency Department (HOSPITAL_COMMUNITY)
Admission: EM | Admit: 2019-09-21 | Discharge: 2019-09-21 | Disposition: A | Discharge status: Home / Self Care | Payer: Medicare Other | Attending: Emergency Medicine | Admitting: Emergency Medicine

## 2019-09-21 DIAGNOSIS — R110 Nausea: Secondary | ICD-10-CM | POA: Diagnosis not present

## 2019-09-21 DIAGNOSIS — Z5321 Procedure and treatment not carried out due to patient leaving prior to being seen by health care provider: Secondary | ICD-10-CM | POA: Insufficient documentation

## 2019-09-21 DIAGNOSIS — R519 Headache, unspecified: Secondary | ICD-10-CM | POA: Insufficient documentation

## 2019-09-21 NOTE — ED Triage Notes (Signed)
Pt BIBA from home.   Per EMS-  Hx of migraines x1 year.  No PCP. Pt reports migraine started 2.5 hrs PTA. Light sensitive, nauseas. Pt took Goody powder and 800 mg ibuprofen today, no relief.

## 2019-11-06 ENCOUNTER — Encounter (HOSPITAL_COMMUNITY): Payer: Self-pay

## 2019-11-06 ENCOUNTER — Emergency Department (HOSPITAL_COMMUNITY): Payer: Medicare Other

## 2019-11-06 ENCOUNTER — Emergency Department (HOSPITAL_COMMUNITY)
Admission: EM | Admit: 2019-11-06 | Discharge: 2019-11-06 | Disposition: A | Discharge status: Home / Self Care | Payer: Medicare Other | Attending: Emergency Medicine | Admitting: Emergency Medicine

## 2019-11-06 DIAGNOSIS — Y92009 Unspecified place in unspecified non-institutional (private) residence as the place of occurrence of the external cause: Secondary | ICD-10-CM | POA: Insufficient documentation

## 2019-11-06 DIAGNOSIS — Y999 Unspecified external cause status: Secondary | ICD-10-CM | POA: Insufficient documentation

## 2019-11-06 DIAGNOSIS — F17210 Nicotine dependence, cigarettes, uncomplicated: Secondary | ICD-10-CM | POA: Diagnosis not present

## 2019-11-06 DIAGNOSIS — Y939 Activity, unspecified: Secondary | ICD-10-CM | POA: Diagnosis not present

## 2019-11-06 DIAGNOSIS — S2232XA Fracture of one rib, left side, initial encounter for closed fracture: Secondary | ICD-10-CM | POA: Diagnosis present

## 2019-11-06 LAB — CBC WITH DIFFERENTIAL/PLATELET
Abs Immature Granulocytes: 0.04 10*3/uL (ref 0.00–0.07)
Basophils Absolute: 0 10*3/uL (ref 0.0–0.1)
Basophils Relative: 0 %
Eosinophils Absolute: 0.2 10*3/uL (ref 0.0–0.5)
Eosinophils Relative: 2 %
HCT: 43.6 % (ref 39.0–52.0)
Hemoglobin: 14.2 g/dL (ref 13.0–17.0)
Immature Granulocytes: 0 %
Lymphocytes Relative: 14 %
Lymphs Abs: 1.6 10*3/uL (ref 0.7–4.0)
MCH: 28.7 pg (ref 26.0–34.0)
MCHC: 32.6 g/dL (ref 30.0–36.0)
MCV: 88.3 fL (ref 80.0–100.0)
Monocytes Absolute: 0.7 10*3/uL (ref 0.1–1.0)
Monocytes Relative: 7 %
Neutro Abs: 8.7 10*3/uL — ABNORMAL HIGH (ref 1.7–7.7)
Neutrophils Relative %: 77 %
Platelets: 242 10*3/uL (ref 150–400)
RBC: 4.94 MIL/uL (ref 4.22–5.81)
RDW: 12.2 % (ref 11.5–15.5)
WBC: 11.4 10*3/uL — ABNORMAL HIGH (ref 4.0–10.5)
nRBC: 0 % (ref 0.0–0.2)

## 2019-11-06 LAB — COMPREHENSIVE METABOLIC PANEL
ALT: 49 U/L — ABNORMAL HIGH (ref 0–44)
AST: 56 U/L — ABNORMAL HIGH (ref 15–41)
Albumin: 3.7 g/dL (ref 3.5–5.0)
Alkaline Phosphatase: 64 U/L (ref 38–126)
Anion gap: 9 (ref 5–15)
BUN: 7 mg/dL (ref 6–20)
CO2: 22 mmol/L (ref 22–32)
Calcium: 8.9 mg/dL (ref 8.9–10.3)
Chloride: 107 mmol/L (ref 98–111)
Creatinine, Ser: 0.83 mg/dL (ref 0.61–1.24)
GFR calc Af Amer: >60 mL/min (ref >60)
GFR calc non Af Amer: >60 mL/min (ref >60)
Glucose, Bld: 83 mg/dL (ref 70–99)
Potassium: 4.3 mmol/L (ref 3.5–5.1)
Sodium: 138 mmol/L (ref 135–145)
Total Bilirubin: 0.6 mg/dL (ref 0.3–1.2)
Total Protein: 7.7 g/dL (ref 6.5–8.1)

## 2019-11-06 MED ORDER — OXYCODONE-ACETAMINOPHEN 5-325 MG PO TABS: 1.0000 | ORAL_TABLET | Freq: Four times a day (QID) | ORAL | 0 refills | Status: AC | PRN

## 2019-11-06 MED ORDER — IOHEXOL 300 MG/ML  SOLN
75.0000 mL | Freq: Once | INTRAMUSCULAR | Status: AC | PRN
  Administered 2019-11-06: 75 mL via INTRAVENOUS

## 2019-11-06 MED ORDER — OXYCODONE-ACETAMINOPHEN 5-325 MG PO TABS
1.0000 | ORAL_TABLET | Freq: Once | ORAL | Status: AC
  Administered 2019-11-06: 1 via ORAL
  Filled 2019-11-06: qty 1

## 2019-11-06 MED ORDER — SODIUM CHLORIDE (PF) 0.9 % IJ SOLN
INTRAMUSCULAR | Status: AC
  Filled 2019-11-06: qty 50

## 2019-11-06 MED ORDER — IBUPROFEN 600 MG PO TABS
600.0000 mg | ORAL_TABLET | Freq: Four times a day (QID) | ORAL | 0 refills | Status: AC | PRN
Start: 2019-11-06 — End: ?

## 2019-11-06 MED ORDER — FENTANYL CITRATE (PF) 100 MCG/2ML IJ SOLN
50.0000 ug | Freq: Once | INTRAMUSCULAR | Status: AC
  Administered 2019-11-06: 50 ug via INTRAVENOUS
  Filled 2019-11-06: qty 2

## 2019-11-06 NOTE — Discharge Instructions (Signed)
You have a fracture of your left second rib.  Use ibuprofen 600 mg every 6 hours for pain, use prescribed oxycodone as needed for breakthrough pain.  This can cause drowsiness, do not take before driving.  Use incentive spirometer throughout the day to help make sure you are taking good deep breaths to help prevent pneumonia.  If you develop worsening pain, difficulty breathing, cough, fever or other new or concerning symptoms return to the emergency department.
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
## 2019-11-06 NOTE — ED Notes (Signed)
100 on RA while ambulating-HR 88
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
## 2019-12-06 ENCOUNTER — Inpatient Hospital Stay (HOSPITAL_COMMUNITY): Payer: Medicare Other

## 2019-12-06 ENCOUNTER — Emergency Department (HOSPITAL_COMMUNITY): Payer: Medicare Other

## 2019-12-06 ENCOUNTER — Inpatient Hospital Stay (HOSPITAL_COMMUNITY)
Admission: EM | Admit: 2019-12-06 | Discharge: 2019-12-13 | DRG: 917 | Disposition: A | Discharge status: Home / Self Care | Payer: Medicare Other | Attending: Internal Medicine | Admitting: Internal Medicine

## 2019-12-06 DIAGNOSIS — J690 Pneumonitis due to inhalation of food and vomit: Secondary | ICD-10-CM | POA: Diagnosis present

## 2019-12-06 DIAGNOSIS — R001 Bradycardia, unspecified: Secondary | ICD-10-CM | POA: Diagnosis present

## 2019-12-06 DIAGNOSIS — Z20822 Contact with and (suspected) exposure to covid-19: Secondary | ICD-10-CM | POA: Diagnosis present

## 2019-12-06 DIAGNOSIS — J811 Chronic pulmonary edema: Secondary | ICD-10-CM | POA: Diagnosis present

## 2019-12-06 DIAGNOSIS — N179 Acute kidney failure, unspecified: Secondary | ICD-10-CM | POA: Diagnosis present

## 2019-12-06 DIAGNOSIS — Z888 Allergy status to other drugs, medicaments and biological substances status: Secondary | ICD-10-CM | POA: Diagnosis not present

## 2019-12-06 DIAGNOSIS — R0902 Hypoxemia: Secondary | ICD-10-CM

## 2019-12-06 DIAGNOSIS — J9602 Acute respiratory failure with hypercapnia: Secondary | ICD-10-CM

## 2019-12-06 DIAGNOSIS — Z9114 Patient's other noncompliance with medication regimen: Secondary | ICD-10-CM

## 2019-12-06 DIAGNOSIS — E875 Hyperkalemia: Secondary | ICD-10-CM | POA: Diagnosis present

## 2019-12-06 DIAGNOSIS — E876 Hypokalemia: Secondary | ICD-10-CM | POA: Diagnosis not present

## 2019-12-06 DIAGNOSIS — R739 Hyperglycemia, unspecified: Secondary | ICD-10-CM | POA: Diagnosis present

## 2019-12-06 DIAGNOSIS — R4182 Altered mental status, unspecified: Secondary | ICD-10-CM | POA: Diagnosis not present

## 2019-12-06 DIAGNOSIS — J9601 Acute respiratory failure with hypoxia: Secondary | ICD-10-CM

## 2019-12-06 DIAGNOSIS — D649 Anemia, unspecified: Secondary | ICD-10-CM | POA: Diagnosis present

## 2019-12-06 DIAGNOSIS — Z01818 Encounter for other preprocedural examination: Secondary | ICD-10-CM | POA: Diagnosis present

## 2019-12-06 DIAGNOSIS — R579 Shock, unspecified: Secondary | ICD-10-CM | POA: Diagnosis present

## 2019-12-06 DIAGNOSIS — J810 Acute pulmonary edema: Secondary | ICD-10-CM

## 2019-12-06 DIAGNOSIS — F209 Schizophrenia, unspecified: Secondary | ICD-10-CM | POA: Diagnosis present

## 2019-12-06 DIAGNOSIS — E872 Acidosis, unspecified: Secondary | ICD-10-CM | POA: Diagnosis present

## 2019-12-06 DIAGNOSIS — R578 Other shock: Secondary | ICD-10-CM

## 2019-12-06 DIAGNOSIS — Z978 Presence of other specified devices: Secondary | ICD-10-CM

## 2019-12-06 DIAGNOSIS — F1910 Other psychoactive substance abuse, uncomplicated: Secondary | ICD-10-CM | POA: Diagnosis not present

## 2019-12-06 DIAGNOSIS — Z0189 Encounter for other specified special examinations: Secondary | ICD-10-CM

## 2019-12-06 DIAGNOSIS — R680 Hypothermia, not associated with low environmental temperature: Secondary | ICD-10-CM | POA: Diagnosis present

## 2019-12-06 DIAGNOSIS — T405X1A Poisoning by cocaine, accidental (unintentional), initial encounter: Secondary | ICD-10-CM | POA: Diagnosis present

## 2019-12-06 DIAGNOSIS — J80 Acute respiratory distress syndrome: Secondary | ICD-10-CM

## 2019-12-06 DIAGNOSIS — K567 Ileus, unspecified: Secondary | ICD-10-CM

## 2019-12-06 DIAGNOSIS — G92 Toxic encephalopathy: Secondary | ICD-10-CM | POA: Diagnosis present

## 2019-12-06 DIAGNOSIS — Z9289 Personal history of other medical treatment: Secondary | ICD-10-CM

## 2019-12-06 DIAGNOSIS — G9340 Encephalopathy, unspecified: Secondary | ICD-10-CM | POA: Diagnosis present

## 2019-12-06 LAB — POCT I-STAT 7, (LYTES, BLD GAS, ICA,H+H)
Acid-base deficit: 6 mmol/L — ABNORMAL HIGH (ref 0.0–2.0)
Acid-base deficit: 6 mmol/L — ABNORMAL HIGH (ref 0.0–2.0)
Acid-base deficit: 5 mmol/L — ABNORMAL HIGH (ref 0.0–2.0)
Bicarbonate: 23.1 mmol/L (ref 20.0–28.0)
Bicarbonate: 23.2 mmol/L (ref 20.0–28.0)
Bicarbonate: 23.3 mmol/L (ref 20.0–28.0)
Calcium, Ion: 1.18 mmol/L (ref 1.15–1.40)
Calcium, Ion: 1.19 mmol/L (ref 1.15–1.40)
Calcium, Ion: 1.19 mmol/L (ref 1.15–1.40)
HCT: 40 % (ref 39.0–52.0)
HCT: 38 % — ABNORMAL LOW (ref 39.0–52.0)
HCT: 36 % — ABNORMAL LOW (ref 39.0–52.0)
Hemoglobin: 13.6 g/dL (ref 13.0–17.0)
Hemoglobin: 12.9 g/dL — ABNORMAL LOW (ref 13.0–17.0)
Hemoglobin: 12.2 g/dL — ABNORMAL LOW (ref 13.0–17.0)
O2 Saturation: 86 %
O2 Saturation: 89 %
O2 Saturation: 98 %
Patient temperature: 33.8
Patient temperature: 98
Patient temperature: 36.7
Potassium: 6.5 mmol/L (ref 3.5–5.1)
Potassium: 5.4 mmol/L — ABNORMAL HIGH (ref 3.5–5.1)
Potassium: 4.8 mmol/L (ref 3.5–5.1)
Sodium: 141 mmol/L (ref 135–145)
Sodium: 142 mmol/L (ref 135–145)
Sodium: 142 mmol/L (ref 135–145)
TCO2: 25 mmol/L (ref 22–32)
TCO2: 25 mmol/L (ref 22–32)
TCO2: 25 mmol/L (ref 22–32)
pCO2 arterial: 54.1 mmHg — ABNORMAL HIGH (ref 32.0–48.0)
pCO2 arterial: 64.1 mmHg — ABNORMAL HIGH (ref 32.0–48.0)
pCO2 arterial: 56.1 mmHg — ABNORMAL HIGH (ref 32.0–48.0)
pH, Arterial: 7.22 — ABNORMAL LOW (ref 7.350–7.450)
pH, Arterial: 7.165 — CL (ref 7.350–7.450)
pH, Arterial: 7.224 — ABNORMAL LOW (ref 7.350–7.450)
pO2, Arterial: 53 mmHg — ABNORMAL LOW (ref 83.0–108.0)
pO2, Arterial: 72 mmHg — ABNORMAL LOW (ref 83.0–108.0)
pO2, Arterial: 129 mmHg — ABNORMAL HIGH (ref 83.0–108.0)

## 2019-12-06 LAB — URINALYSIS, ROUTINE W REFLEX MICROSCOPIC
Bilirubin Urine: NEGATIVE
Bilirubin Urine: NEGATIVE
Glucose, UA: NEGATIVE mg/dL
Glucose, UA: NEGATIVE mg/dL
Hgb urine dipstick: NEGATIVE
Ketones, ur: NEGATIVE mg/dL
Ketones, ur: NEGATIVE mg/dL
Leukocytes,Ua: NEGATIVE
Leukocytes,Ua: NEGATIVE
Nitrite: NEGATIVE
Nitrite: NEGATIVE
Protein, ur: 30 mg/dL — AB
Protein, ur: NEGATIVE mg/dL
Specific Gravity, Urine: 1.028 (ref 1.005–1.030)
Specific Gravity, Urine: 1.019 (ref 1.005–1.030)
pH: 5 (ref 5.0–8.0)
pH: 5 (ref 5.0–8.0)

## 2019-12-06 LAB — COMPREHENSIVE METABOLIC PANEL
ALT: 23 U/L (ref 0–44)
ALT: 31 U/L (ref 0–44)
AST: 32 U/L (ref 15–41)
AST: 39 U/L (ref 15–41)
Albumin: 2.5 g/dL — ABNORMAL LOW (ref 3.5–5.0)
Albumin: 2.6 g/dL — ABNORMAL LOW (ref 3.5–5.0)
Alkaline Phosphatase: 66 U/L (ref 38–126)
Alkaline Phosphatase: 61 U/L (ref 38–126)
Anion gap: 10 (ref 5–15)
Anion gap: 3 — ABNORMAL LOW (ref 5–15)
BUN: 6 mg/dL (ref 6–20)
BUN: 8 mg/dL (ref 6–20)
CO2: 22 mmol/L (ref 22–32)
CO2: 22 mmol/L (ref 22–32)
Calcium: 8.3 mg/dL — ABNORMAL LOW (ref 8.9–10.3)
Calcium: 7.4 mg/dL — ABNORMAL LOW (ref 8.9–10.3)
Chloride: 102 mmol/L (ref 98–111)
Chloride: 113 mmol/L — ABNORMAL HIGH (ref 98–111)
Creatinine, Ser: 1.58 mg/dL — ABNORMAL HIGH (ref 0.61–1.24)
Creatinine, Ser: 1.2 mg/dL (ref 0.61–1.24)
GFR calc Af Amer: 30 mL/min — ABNORMAL LOW (ref >60)
GFR calc Af Amer: >60 mL/min (ref >60)
GFR calc non Af Amer: 26 mL/min — ABNORMAL LOW (ref >60)
GFR calc non Af Amer: >60 mL/min (ref >60)
Glucose, Bld: 420 mg/dL — ABNORMAL HIGH (ref 70–99)
Glucose, Bld: 108 mg/dL — ABNORMAL HIGH (ref 70–99)
Potassium: 4.1 mmol/L (ref 3.5–5.1)
Potassium: 6.6 mmol/L (ref 3.5–5.1)
Sodium: 134 mmol/L — ABNORMAL LOW (ref 135–145)
Sodium: 138 mmol/L (ref 135–145)
Total Bilirubin: 0.5 mg/dL (ref 0.3–1.2)
Total Bilirubin: 0.6 mg/dL (ref 0.3–1.2)
Total Protein: 5.2 g/dL — ABNORMAL LOW (ref 6.5–8.1)
Total Protein: 5.6 g/dL — ABNORMAL LOW (ref 6.5–8.1)

## 2019-12-06 LAB — CBC WITH DIFFERENTIAL/PLATELET
Abs Immature Granulocytes: 0.16 10*3/uL — ABNORMAL HIGH (ref 0.00–0.07)
Basophils Absolute: 0.1 10*3/uL (ref 0.0–0.1)
Basophils Relative: 1 %
Eosinophils Absolute: 0.2 10*3/uL (ref 0.0–0.5)
Eosinophils Relative: 2 %
HCT: 38.8 % — ABNORMAL LOW (ref 39.0–52.0)
Hemoglobin: 11.9 g/dL — ABNORMAL LOW (ref 13.0–17.0)
Immature Granulocytes: 2 %
Lymphocytes Relative: 31 %
Lymphs Abs: 2.9 10*3/uL (ref 0.7–4.0)
MCH: 27.9 pg (ref 26.0–34.0)
MCHC: 30.7 g/dL (ref 30.0–36.0)
MCV: 90.9 fL (ref 80.0–100.0)
Monocytes Absolute: 0.3 10*3/uL (ref 0.1–1.0)
Monocytes Relative: 4 %
Neutro Abs: 5.7 10*3/uL (ref 1.7–7.7)
Neutrophils Relative %: 60 %
Platelets: 359 10*3/uL (ref 150–400)
RBC: 4.27 MIL/uL (ref 4.22–5.81)
RDW: 12.7 % (ref 11.5–15.5)
WBC: 9.4 10*3/uL (ref 4.0–10.5)
nRBC: 0 % (ref 0.0–0.2)

## 2019-12-06 LAB — BASIC METABOLIC PANEL
Anion gap: 5 (ref 5–15)
BUN: 10 mg/dL (ref 6–20)
CO2: 22 mmol/L (ref 22–32)
Calcium: 7.4 mg/dL — ABNORMAL LOW (ref 8.9–10.3)
Chloride: 111 mmol/L (ref 98–111)
Creatinine, Ser: 1.07 mg/dL (ref 0.61–1.24)
GFR calc Af Amer: >60 mL/min (ref >60)
GFR calc non Af Amer: >60 mL/min (ref >60)
Glucose, Bld: 131 mg/dL — ABNORMAL HIGH (ref 70–99)
Potassium: 5 mmol/L (ref 3.5–5.1)
Sodium: 138 mmol/L (ref 135–145)

## 2019-12-06 LAB — ETHANOL: Alcohol, Ethyl (B): <10 mg/dL (ref <10)

## 2019-12-06 LAB — I-STAT CHEM 8, ED
BUN: 6 mg/dL — ABNORMAL LOW (ref 8–23)
Calcium, Ion: 1.23 mmol/L (ref 1.15–1.40)
Chloride: 101 mmol/L (ref 98–111)
Creatinine, Ser: 1.4 mg/dL — ABNORMAL HIGH (ref 0.61–1.24)
Glucose, Bld: 367 mg/dL — ABNORMAL HIGH (ref 70–99)
HCT: 41 % (ref 39.0–52.0)
Hemoglobin: 13.9 g/dL (ref 13.0–17.0)
Potassium: 4.2 mmol/L (ref 3.5–5.1)
Sodium: 136 mmol/L (ref 135–145)
TCO2: 23 mmol/L (ref 22–32)

## 2019-12-06 LAB — POCT I-STAT, CHEM 8
BUN: 6 mg/dL (ref 6–20)
Calcium, Ion: 1.23 mmol/L (ref 1.15–1.40)
Chloride: 101 mmol/L (ref 98–111)
Creatinine, Ser: 1.4 mg/dL — ABNORMAL HIGH (ref 0.61–1.24)
Glucose, Bld: 367 mg/dL — ABNORMAL HIGH (ref 70–99)
HCT: 41 % (ref 39.0–52.0)
Hemoglobin: 13.9 g/dL (ref 13.0–17.0)
Potassium: 4.2 mmol/L (ref 3.5–5.1)
Sodium: 136 mmol/L (ref 135–145)
TCO2: 23 mmol/L (ref 22–32)

## 2019-12-06 LAB — I-STAT ARTERIAL BLOOD GAS, ED
Acid-base deficit: 5 mmol/L — ABNORMAL HIGH (ref 0.0–2.0)
Bicarbonate: 24.6 mmol/L (ref 20.0–28.0)
Calcium, Ion: 1.24 mmol/L (ref 1.15–1.40)
HCT: 34 % — ABNORMAL LOW (ref 39.0–52.0)
Hemoglobin: 11.6 g/dL — ABNORMAL LOW (ref 13.0–17.0)
O2 Saturation: 76 %
Patient temperature: 96.6
Potassium: 4.4 mmol/L (ref 3.5–5.1)
Sodium: 138 mmol/L (ref 135–145)
TCO2: 27 mmol/L (ref 22–32)
pCO2 arterial: 68.4 mmHg (ref 32.0–48.0)
pH, Arterial: 7.156 — CL (ref 7.350–7.450)
pO2, Arterial: 51 mmHg — ABNORMAL LOW (ref 83.0–108.0)

## 2019-12-06 LAB — AMMONIA: Ammonia: 18 umol/L (ref 9–35)

## 2019-12-06 LAB — LACTIC ACID, PLASMA
Lactic Acid, Venous: 6.2 mmol/L (ref 0.5–1.9)
Lactic Acid, Venous: 1.2 mmol/L (ref 0.5–1.9)
Lactic Acid, Venous: 0.9 mmol/L (ref 0.5–1.9)

## 2019-12-06 LAB — GLUCOSE, CAPILLARY
Glucose-Capillary: 85 mg/dL (ref 70–99)
Glucose-Capillary: 96 mg/dL (ref 70–99)
Glucose-Capillary: 118 mg/dL — ABNORMAL HIGH (ref 70–99)
Glucose-Capillary: 125 mg/dL — ABNORMAL HIGH (ref 70–99)
Glucose-Capillary: 135 mg/dL — ABNORMAL HIGH (ref 70–99)
Glucose-Capillary: 98 mg/dL (ref 70–99)

## 2019-12-06 LAB — TROPONIN I (HIGH SENSITIVITY)
Troponin I (High Sensitivity): 6 ng/L (ref <18)
Troponin I (High Sensitivity): 6 ng/L (ref <18)

## 2019-12-06 LAB — TYPE AND SCREEN
ABO/RH(D): A POS
Antibody Screen: NEGATIVE

## 2019-12-06 LAB — MAGNESIUM: Magnesium: 1.7 mg/dL (ref 1.7–2.4)

## 2019-12-06 LAB — RAPID URINE DRUG SCREEN, HOSP PERFORMED
Amphetamines: POSITIVE — AB
Barbiturates: NOT DETECTED
Benzodiazepines: NOT DETECTED
Cocaine: POSITIVE — AB
Opiates: NOT DETECTED
Tetrahydrocannabinol: POSITIVE — AB

## 2019-12-06 LAB — ABO/RH: ABO/RH(D): A POS

## 2019-12-06 LAB — HEPATITIS PANEL, ACUTE
HCV Ab: NONREACTIVE
Hep A IgM: NONREACTIVE
Hep B C IgM: NONREACTIVE
Hepatitis B Surface Ag: NONREACTIVE

## 2019-12-06 LAB — ECHOCARDIOGRAM COMPLETE
Height: 69 in
Weight: 2649.05 oz

## 2019-12-06 LAB — PROCALCITONIN: Procalcitonin: 5.8 ng/mL

## 2019-12-06 LAB — PHOSPHORUS: Phosphorus: 3.1 mg/dL (ref 2.5–4.6)

## 2019-12-06 LAB — BRAIN NATRIURETIC PEPTIDE: B Natriuretic Peptide: 20.9 pg/mL (ref 0.0–100.0)

## 2019-12-06 LAB — RAPID HIV SCREEN (HIV 1/2 AB+AG)
HIV 1/2 Antibodies: NONREACTIVE
HIV-1 P24 Antigen - HIV24: NONREACTIVE

## 2019-12-06 LAB — SARS CORONAVIRUS 2 BY RT PCR (HOSPITAL ORDER, PERFORMED IN ~~LOC~~ HOSPITAL LAB): SARS Coronavirus 2: NEGATIVE

## 2019-12-06 LAB — ACETAMINOPHEN LEVEL: Acetaminophen (Tylenol), Serum: <10 ug/mL — ABNORMAL LOW (ref 10–30)

## 2019-12-06 LAB — CBG MONITORING, ED: Glucose-Capillary: 89 mg/dL (ref 70–99)

## 2019-12-06 LAB — CK: Total CK: 227 U/L (ref 49–397)

## 2019-12-06 LAB — MRSA PCR SCREENING: MRSA by PCR: POSITIVE — AB

## 2019-12-06 LAB — BETA-HYDROXYBUTYRIC ACID: Beta-Hydroxybutyric Acid: 0.13 mmol/L (ref 0.05–0.27)

## 2019-12-06 LAB — SALICYLATE LEVEL: Salicylate Lvl: <7 mg/dL — ABNORMAL LOW (ref 7.0–30.0)

## 2019-12-06 MED ORDER — INSULIN ASPART 100 UNIT/ML ~~LOC~~ SOLN
5.0000 [IU] | Freq: Once | SUBCUTANEOUS | Status: AC
  Administered 2019-12-06: 5 [IU] via INTRAVENOUS

## 2019-12-06 MED ORDER — GADOBUTROL 1 MMOL/ML IV SOLN
7.5000 mL | Freq: Once | INTRAVENOUS | Status: AC | PRN
  Administered 2019-12-06: 7.5 mL via INTRAVENOUS

## 2019-12-06 MED ORDER — DEXTROSE 50 % IV SOLN
1.0000 | Freq: Once | INTRAVENOUS | Status: AC
  Administered 2019-12-06: 50 mL via INTRAVENOUS
  Filled 2019-12-06: qty 50

## 2019-12-06 MED ORDER — NOREPINEPHRINE 4 MG/250ML-% IV SOLN
0.0000 ug/min | INTRAVENOUS | Status: DC
  Administered 2019-12-06: 5 ug/min via INTRAVENOUS
  Filled 2019-12-06 (×2): qty 250

## 2019-12-06 MED ORDER — FENTANYL CITRATE (PF) 100 MCG/2ML IJ SOLN: 50.0000 ug | INTRAMUSCULAR | Status: DC | PRN

## 2019-12-06 MED ORDER — POLYETHYLENE GLYCOL 3350 17 G PO PACK: 17.0000 g | PACK | Freq: Every day | ORAL | Status: DC | PRN

## 2019-12-06 MED ORDER — PIPERACILLIN-TAZOBACTAM 3.375 G IVPB
3.3750 g | Freq: Three times a day (TID) | INTRAVENOUS | Status: AC
  Administered 2019-12-06 – 2019-12-13 (×20): 3.375 g via INTRAVENOUS
  Filled 2019-12-06 (×23): qty 50

## 2019-12-06 MED ORDER — VANCOMYCIN HCL 750 MG/150ML IV SOLN
750.0000 mg | Freq: Three times a day (TID) | INTRAVENOUS | Status: DC
  Administered 2019-12-06 – 2019-12-07 (×3): 750 mg via INTRAVENOUS
  Filled 2019-12-06 (×4): qty 150

## 2019-12-06 MED ORDER — FENTANYL CITRATE (PF) 100 MCG/2ML IJ SOLN
INTRAMUSCULAR | Status: AC | PRN
  Administered 2019-12-06: 200 ug via INTRAVENOUS

## 2019-12-06 MED ORDER — SODIUM ZIRCONIUM CYCLOSILICATE 5 G PO PACK
10.0000 g | PACK | Freq: Once | ORAL | Status: AC
  Administered 2019-12-06: 10 g
  Filled 2019-12-06: qty 2

## 2019-12-06 MED ORDER — SODIUM CHLORIDE 0.9 % IV SOLN
INTRAVENOUS | Status: DC | PRN
  Administered 2019-12-06: 250 mL via INTRAVENOUS

## 2019-12-06 MED ORDER — ALBUTEROL SULFATE (2.5 MG/3ML) 0.083% IN NEBU
2.5000 mg | INHALATION_SOLUTION | RESPIRATORY_TRACT | Status: DC
  Administered 2019-12-06 – 2019-12-08 (×13): 2.5 mg via RESPIRATORY_TRACT
  Filled 2019-12-06 (×13): qty 3

## 2019-12-06 MED ORDER — HEPARIN SODIUM (PORCINE) 5000 UNIT/ML IJ SOLN
5000.0000 [IU] | Freq: Three times a day (TID) | INTRAMUSCULAR | Status: DC
  Administered 2019-12-06 – 2019-12-13 (×22): 5000 [IU] via SUBCUTANEOUS
  Filled 2019-12-06 (×21): qty 1

## 2019-12-06 MED ORDER — SODIUM CHLORIDE 0.9 % IV SOLN
INTRAVENOUS | Status: AC | PRN
  Administered 2019-12-06 (×2): 1000 mL via INTRAVENOUS

## 2019-12-06 MED ORDER — VITAL 1.5 CAL PO LIQD
1000.0000 mL | ORAL | Status: DC
  Filled 2019-12-06 (×2): qty 1000

## 2019-12-06 MED ORDER — PIPERACILLIN-TAZOBACTAM 3.375 G IVPB 30 MIN
3.3750 g | Freq: Once | INTRAVENOUS | Status: AC
  Administered 2019-12-06: 3.375 g via INTRAVENOUS
  Filled 2019-12-06: qty 50

## 2019-12-06 MED ORDER — SODIUM CHLORIDE 0.9% FLUSH: 10.0000 mL | INTRAVENOUS | Status: DC | PRN

## 2019-12-06 MED ORDER — CALCIUM GLUCONATE-NACL 1-0.675 GM/50ML-% IV SOLN
1.0000 g | Freq: Once | INTRAVENOUS | Status: AC
  Administered 2019-12-06: 1000 mg via INTRAVENOUS
  Filled 2019-12-06: qty 50

## 2019-12-06 MED ORDER — MUPIROCIN 2 % EX OINT
1.0000 "application " | TOPICAL_OINTMENT | Freq: Two times a day (BID) | CUTANEOUS | Status: AC
  Administered 2019-12-06 – 2019-12-10 (×10): 1 via NASAL
  Filled 2019-12-06: qty 22

## 2019-12-06 MED ORDER — FENTANYL 2500MCG IN NS 250ML (10MCG/ML) PREMIX INFUSION
0.0000 ug/h | INTRAVENOUS | Status: DC
  Administered 2019-12-06: 50 ug/h via INTRAVENOUS
  Filled 2019-12-06: qty 250

## 2019-12-06 MED ORDER — ACETAMINOPHEN 160 MG/5ML PO SOLN
650.0000 mg | Freq: Four times a day (QID) | ORAL | Status: DC | PRN
  Administered 2019-12-07 – 2019-12-10 (×3): 650 mg
  Filled 2019-12-06 (×3): qty 20.3

## 2019-12-06 MED ORDER — DOCUSATE SODIUM 50 MG/5ML PO LIQD: 100.0000 mg | Freq: Two times a day (BID) | ORAL | Status: DC | PRN

## 2019-12-06 MED ORDER — THIAMINE HCL 100 MG/ML IJ SOLN
100.0000 mg | Freq: Every day | INTRAMUSCULAR | Status: DC
  Administered 2019-12-06 – 2019-12-12 (×7): 100 mg via INTRAVENOUS
  Filled 2019-12-06 (×7): qty 2

## 2019-12-06 MED ORDER — CHLORHEXIDINE GLUCONATE 0.12% ORAL RINSE (MEDLINE KIT)
15.0000 mL | Freq: Two times a day (BID) | OROMUCOSAL | Status: DC
  Administered 2019-12-06 – 2019-12-13 (×13): 15 mL via OROMUCOSAL

## 2019-12-06 MED ORDER — FENTANYL CITRATE (PF) 100 MCG/2ML IJ SOLN
INTRAMUSCULAR | Status: AC
  Administered 2019-12-07: 25 ug via INTRAVENOUS
  Filled 2019-12-06: qty 4

## 2019-12-06 MED ORDER — LACTATED RINGERS IV BOLUS
500.0000 mL | Freq: Once | INTRAVENOUS | Status: AC
  Administered 2019-12-06: 500 mL via INTRAVENOUS

## 2019-12-06 MED ORDER — FOLIC ACID 5 MG/ML IJ SOLN
1.0000 mg | Freq: Every day | INTRAMUSCULAR | Status: DC
  Administered 2019-12-06 – 2019-12-12 (×7): 1 mg via INTRAVENOUS
  Filled 2019-12-06 (×8): qty 0.2

## 2019-12-06 MED ORDER — INSULIN ASPART 100 UNIT/ML ~~LOC~~ SOLN
0.0000 [IU] | SUBCUTANEOUS | Status: DC
  Administered 2019-12-06 – 2019-12-13 (×4): 1 [IU] via SUBCUTANEOUS

## 2019-12-06 MED ORDER — VANCOMYCIN HCL 1500 MG/300ML IV SOLN
1500.0000 mg | Freq: Once | INTRAVENOUS | Status: AC
  Administered 2019-12-06: 1500 mg via INTRAVENOUS
  Filled 2019-12-06: qty 300

## 2019-12-06 MED ORDER — LACTATED RINGERS IV BOLUS: 1000.0000 mL | Freq: Once | INTRAVENOUS | Status: DC

## 2019-12-06 MED ORDER — SODIUM CHLORIDE 0.9 % IV SOLN: INTRAVENOUS | Status: DC

## 2019-12-06 MED ORDER — CHLORHEXIDINE GLUCONATE CLOTH 2 % EX PADS
6.0000 | MEDICATED_PAD | Freq: Every day | CUTANEOUS | Status: DC
  Administered 2019-12-07 – 2019-12-12 (×6): 6 via TOPICAL

## 2019-12-06 MED ORDER — PANTOPRAZOLE SODIUM 40 MG IV SOLR
40.0000 mg | Freq: Every day | INTRAVENOUS | Status: DC
  Administered 2019-12-06 – 2019-12-11 (×6): 40 mg via INTRAVENOUS
  Filled 2019-12-06 (×6): qty 40

## 2019-12-06 MED ORDER — SODIUM CHLORIDE 0.9% FLUSH
10.0000 mL | Freq: Two times a day (BID) | INTRAVENOUS | Status: DC
  Administered 2019-12-06: 20 mL
  Administered 2019-12-06 – 2019-12-13 (×10): 10 mL

## 2019-12-06 MED ORDER — ORAL CARE MOUTH RINSE
15.0000 mL | OROMUCOSAL | Status: DC
  Administered 2019-12-06 – 2019-12-11 (×52): 15 mL via OROMUCOSAL

## 2019-12-06 MED ORDER — PERFLUTREN LIPID MICROSPHERE
1.0000 mL | INTRAVENOUS | Status: AC | PRN
  Administered 2019-12-06: 5 mL via INTRAVENOUS
  Filled 2019-12-06: qty 10

## 2019-12-06 MED ORDER — VITAL 1.5 CAL PO LIQD
1000.0000 mL | ORAL | Status: DC
  Administered 2019-12-07 – 2019-12-11 (×5): 1000 mL
  Filled 2019-12-06 (×8): qty 1000

## 2019-12-06 MED ORDER — DOCUSATE SODIUM 100 MG PO CAPS: 100.0000 mg | ORAL_CAPSULE | Freq: Two times a day (BID) | ORAL | Status: DC | PRN

## 2019-12-06 MED ORDER — ACETAMINOPHEN 160 MG/5ML PO SOLN
650.0000 mg | Freq: Four times a day (QID) | ORAL | Status: DC | PRN
  Administered 2019-12-06: 650 mg
  Filled 2019-12-06: qty 20.3

## 2019-12-06 MED ORDER — VANCOMYCIN HCL 750 MG/150ML IV SOLN: 750.0000 mg | Freq: Two times a day (BID) | INTRAVENOUS | Status: DC

## 2019-12-06 MED ORDER — CHLORHEXIDINE GLUCONATE CLOTH 2 % EX PADS
6.0000 | MEDICATED_PAD | Freq: Every day | CUTANEOUS | Status: DC
  Administered 2019-12-06: 6 via TOPICAL

## 2019-12-06 MED ORDER — PROSOURCE TF PO LIQD
45.0000 mL | Freq: Every day | ORAL | Status: DC
  Administered 2019-12-06 – 2019-12-10 (×5): 45 mL
  Filled 2019-12-06 (×7): qty 45

## 2019-12-06 MED ORDER — PROPOFOL 1000 MG/100ML IV EMUL
5.0000 ug/kg/min | INTRAVENOUS | Status: DC
  Administered 2019-12-06: 5 ug/kg/min via INTRAVENOUS
  Administered 2019-12-06: 50 ug/kg/min via INTRAVENOUS
  Administered 2019-12-06: 40 ug/kg/min via INTRAVENOUS
  Administered 2019-12-07: 60 ug/kg/min via INTRAVENOUS
  Administered 2019-12-07: 40 ug/kg/min via INTRAVENOUS
  Administered 2019-12-07: 60 ug/kg/min via INTRAVENOUS
  Administered 2019-12-08: 25 ug/kg/min via INTRAVENOUS
  Administered 2019-12-08 – 2019-12-09 (×2): 10 ug/kg/min via INTRAVENOUS
  Administered 2019-12-10: 25 ug/kg/min via INTRAVENOUS
  Administered 2019-12-10: 35 ug/kg/min via INTRAVENOUS
  Administered 2019-12-10 (×2): 25 ug/kg/min via INTRAVENOUS
  Administered 2019-12-11: 35 ug/kg/min via INTRAVENOUS
  Filled 2019-12-06 (×18): qty 100

## 2019-12-06 MED ORDER — FENTANYL CITRATE (PF) 100 MCG/2ML IJ SOLN
50.0000 ug | INTRAMUSCULAR | Status: AC | PRN
  Administered 2019-12-06 – 2019-12-07 (×3): 50 ug via INTRAVENOUS
  Filled 2019-12-06 (×4): qty 2
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
## 2019-12-06 NOTE — ED Notes (Signed)
A lady called Britt Boozer) from [phone] advised she is patient's sister in law.  She gave patients name as Jeffrey Merritt DOB Sep 07, 1980.  She stated patient's mom's name is Yariel Ferraris @ [phone]
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
## 2019-12-06 NOTE — Progress Notes (Signed)
Settings changed by MD. 

## 2019-12-06 NOTE — Progress Notes (Signed)
Patients mother, Fulton Mole, took home patients clothing.

## 2019-12-06 NOTE — ED Provider Notes (Addendum)
[facility] EM[facility]MENT Provider Note   CSN: [id number] Arrival date & time: 12/06/19  0143     History Chief Complaint  Patient presents with  . Drug Overdose    Jeffrey Merritt is a 39 y.o. male.  Patient brought to the emergency department by EMS from a motel.  EMS reports that a friend called for suspected overdose.  They were apparently using cocaine.  Any additional information is unavailable at this time.  EMS report that patient received multiple doses of Narcan without response.  He was essentially unresponsive and they intubated him at the scene. Level V Caveat due to acuity.        No past medical history on file.  Patient Active Problem List   Diagnosis Date Noted  . Acute respiratory failure with hypoxia ([place]) 12/06/2019       No family history on file.  Social History   Tobacco Use  . Smoking status: Not on file  Substance Use Topics  . Alcohol use: Not on file  . Drug use: Not on file    Home Medications Prior to Admission medications   Not on File    Allergies    Patient has no allergy information on record.  Review of Systems   Review of Systems  Unable to perform ROS: Acuity of condition    Physical Exam Updated Vital Signs BP 112/65   Pulse 89   Temp (!) 92.6 F (33.7 C)   Resp (!) 28   SpO2 (!) 89%   Physical Exam Constitutional:      Appearance: He is ill-appearing.  HENT:     Head: Atraumatic.     Nose:     Comments: Nasal trumpet in place left nare Cardiovascular:     Rate and Rhythm: Normal rate.     Pulses: Normal pulses.  Pulmonary:     Comments: Patient being bagged via endotracheal tube.  Patient also taking breaths on his own, diffuse coarse breath sounds heard with the patient initiated breaths Abdominal:     General: There is distension.  Musculoskeletal:        General: No deformity.     Cervical back: Neck supple.  Skin:    General: Skin is warm.  Neurological:      Comments: Unresponsive     ED Results / Procedures / Treatments   Labs (all labs ordered are listed, but only abnormal results are displayed) Labs Reviewed  CBC WITH DIFFERENTIAL/PLATELET - Abnormal; Notable for the following components:      Result Value   Hemoglobin 11.9 (*)    HCT 38.8 (*)    Abs Immature Granulocytes 0.16 (*)    All other components within normal limits  COMPREHENSIVE METABOLIC PANEL - Abnormal; Notable for the following components:   Sodium 134 (*)    Glucose, Bld 420 (*)    BUN 6 (*)    Creatinine, Ser 1.58 (*)    Calcium 8.3 (*)    Total Protein 5.2 (*)    Albumin 2.5 (*)    GFR calc non Af Amer 26 (*)    GFR calc Af Amer 30 (*)    All other components within normal limits  LACTIC ACID, PLASMA - Abnormal; Notable for the following components:   Lactic Acid, Venous 6.2 (*)    All other components within normal limits  SALICYLATE LEVEL - Abnormal; Notable for the following components:   Salicylate Lvl <7.7 (*)    All other  components within normal limits  ACETAMINOPHEN LEVEL - Abnormal; Notable for the following components:   Acetaminophen (Tylenol), Serum <10 (*)    All other components within normal limits  I-STAT CHEM 8, ED - Abnormal; Notable for the following components:   BUN 6 (*)    Creatinine, Ser 1.40 (*)    Glucose, Bld 367 (*)    All other components within normal limits  I-STAT ARTERIAL BLOOD GAS, ED - Abnormal; Notable for the following components:   pH, Arterial 7.156 (*)    pCO2 arterial 68.4 (*)    pO2, Arterial 51 (*)    Acid-base deficit 5.0 (*)    HCT 34.0 (*)    Hemoglobin 11.6 (*)    All other components within normal limits  SARS CORONAVIRUS 2 (HOSPITAL ORDER, [facility])  CULTURE, BLOOD (ROUTINE X 2)  CULTURE, BLOOD (ROUTINE X 2)  MRSA PCR SCREENING  ETHANOL  BETA-HYDROXYBUTYRIC ACID  CK  URINALYSIS, ROUTINE W REFLEX MICROSCOPIC  AMMONIA  RAPID URINE DRUG SCREEN, HOSP PERFORMED  RAPID HIV  SCREEN (HIV 1/2 AB+AG)  HEPATITIS PANEL, ACUTE  BRAIN NATRIURETIC PEPTIDE  MISC [facility] TEST (SEND OUT)  HEMOGLOBIN A1C  PROCALCITONIN  LACTIC ACID, PLASMA  TYPE AND SCREEN  TROPONIN I (HIGH SENSITIVITY)  TROPONIN I (HIGH SENSITIVITY)    EKG None  Radiology CT HEAD WO CONTRAST  Result Date: 12/06/2019 CLINICAL DATA:  Unresponsive, possible fall EXAM: CT HEAD WITHOUT CONTRAST CT CERVICAL SPINE WITHOUT CONTRAST TECHNIQUE: Multidetector CT imaging of the head and cervical spine was performed following the standard protocol without intravenous contrast. Multiplanar CT image reconstructions of the cervical spine were also generated. COMPARISON:  None. FINDINGS: CT HEAD FINDINGS Brain: No evidence of acute infarction, hemorrhage, hydrocephalus, extra-axial collection or mass lesion/mass effect. Basal cisterns are patent. Cerebellar tonsils are normally position. Few benign appearing dural calcifications. Vascular: No hyperdense vessel or unexpected calcification. Skull: Few sites of mild scalp infiltration over the left temporal and parietal scalp. No large hematoma. No calvarial fracture or acute osseous injury. Remote appearing right lamina papyracea fracture. Additional remote appearing fracture of the right zygomatic arch. Age indeterminate nasal bone deformities, favor remote as well. Sinuses/Orbits: Patient is intubated with transesophageal and endotracheal tubes at the time of exam. Some mild thickening in the nasal passages and ethmoids likely related to this instrumentation. Secretions noted in the posterior naso and oropharynx. Included orbital structures are unremarkable. Other: None CT CERVICAL SPINE FINDINGS Alignment: Cervical stabilization collar is in place at the time of examination. Mild leftward cranial rotation despite the stabilization device. No evidence of traumatic listhesis. No abnormally widened, perched or jumped facets. Normal alignment of the craniocervical and atlantoaxial  articulations accounting for rotation. Skull base and vertebrae: No acute skull base fracture. No vertebral body fracture or height loss is seen. Irregular sclerosis and osteophyte formation about the C5-6 vertebral bodies anteriorly favored to be degenerative or less likely remote posttraumatic in nature. Soft tissues and spinal canal: No pre or paravertebral fluid or swelling. No visible canal hematoma. Elongated styloid processes with calcification of the stylohyoid ligaments. Disc levels: Multilevel intervertebral disc height loss with spondylitic endplate changes. Few mild posterior disc osteophyte complexes without significant canal stenosis or neural foraminal narrowing in the cervical spine. Upper chest: Ground-glass and consolidative opacities in the lungs with some associated septal thickening could reflect interstitial edema though infection, aspiration or ARDS would present with a similar appearance. Endotracheal tube terminates in the mid to upper trachea.  Transesophageal tube continues below the level of imaging. Other: A plate and screw fixation of the remote left mandibular fracture is noted. IMPRESSION: 1. No acute intracranial abnormality. 2. Few sites of mild scalp infiltration over the left temporal and parietal scalp. No large hematoma or calvarial fracture. 3. No evidence of acute fracture or traumatic listhesis of the cervical spine. 4. Multilevel degenerative changes of the cervical spine, as above. 5. Ground-glass and consolidative opacities in the lungs with some associated septal thickening could reflect interstitial edema though infection or ARDS would present with a similar appearance. 6. Remote appearing right lamina papyracea fracture. Additional remote appearing fracture of the right zygomatic arch and left mandible. Age indeterminate nasal bone deformities, favor remote as well though could correlate for point tenderness. 7. Elongated styloid processes with calcification of the  stylohyoid ligaments, which can be seen in the setting of Eagle syndrome. Electronically Signed   By: Lovena Le M.D.   On: 12/06/2019 02:51   CT CERVICAL SPINE WO CONTRAST  Result Date: 12/06/2019 CLINICAL DATA:  Unresponsive, possible fall EXAM: CT HEAD WITHOUT CONTRAST CT CERVICAL SPINE WITHOUT CONTRAST TECHNIQUE: Multidetector CT imaging of the head and cervical spine was performed following the standard protocol without intravenous contrast. Multiplanar CT image reconstructions of the cervical spine were also generated. COMPARISON:  None. FINDINGS: CT HEAD FINDINGS Brain: No evidence of acute infarction, hemorrhage, hydrocephalus, extra-axial collection or mass lesion/mass effect. Basal cisterns are patent. Cerebellar tonsils are normally position. Few benign appearing dural calcifications. Vascular: No hyperdense vessel or unexpected calcification. Skull: Few sites of mild scalp infiltration over the left temporal and parietal scalp. No large hematoma. No calvarial fracture or acute osseous injury. Remote appearing right lamina papyracea fracture. Additional remote appearing fracture of the right zygomatic arch. Age indeterminate nasal bone deformities, favor remote as well. Sinuses/Orbits: Patient is intubated with transesophageal and endotracheal tubes at the time of exam. Some mild thickening in the nasal passages and ethmoids likely related to this instrumentation. Secretions noted in the posterior naso and oropharynx. Included orbital structures are unremarkable. Other: None CT CERVICAL SPINE FINDINGS Alignment: Cervical stabilization collar is in place at the time of examination. Mild leftward cranial rotation despite the stabilization device. No evidence of traumatic listhesis. No abnormally widened, perched or jumped facets. Normal alignment of the craniocervical and atlantoaxial articulations accounting for rotation. Skull base and vertebrae: No acute skull base fracture. No vertebral body  fracture or height loss is seen. Irregular sclerosis and osteophyte formation about the C5-6 vertebral bodies anteriorly favored to be degenerative or less likely remote posttraumatic in nature. Soft tissues and spinal canal: No pre or paravertebral fluid or swelling. No visible canal hematoma. Elongated styloid processes with calcification of the stylohyoid ligaments. Disc levels: Multilevel intervertebral disc height loss with spondylitic endplate changes. Few mild posterior disc osteophyte complexes without significant canal stenosis or neural foraminal narrowing in the cervical spine. Upper chest: Ground-glass and consolidative opacities in the lungs with some associated septal thickening could reflect interstitial edema though infection, aspiration or ARDS would present with a similar appearance. Endotracheal tube terminates in the mid to upper trachea. Transesophageal tube continues below the level of imaging. Other: A plate and screw fixation of the remote left mandibular fracture is noted. IMPRESSION: 1. No acute intracranial abnormality. 2. Few sites of mild scalp infiltration over the left temporal and parietal scalp. No large hematoma or calvarial fracture. 3. No evidence of acute fracture or traumatic listhesis of the cervical spine. 4. Multilevel  degenerative changes of the cervical spine, as above. 5. Ground-glass and consolidative opacities in the lungs with some associated septal thickening could reflect interstitial edema though infection or ARDS would present with a similar appearance. 6. Remote appearing right lamina papyracea fracture. Additional remote appearing fracture of the right zygomatic arch and left mandible. Age indeterminate nasal bone deformities, favor remote as well though could correlate for point tenderness. 7. 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 Electronically Signed   By: Lovena Le  M.D.   On: 12/06/2019 02:51   DG Chest Portable 1 View  Result Date: 12/06/2019 CLINICAL DATA:  Status post central line placement EXAM: PORTABLE CHEST 1 VIEW COMPARISON:  Film from earlier in the same day. FINDINGS: Cardiac shadow is within normal limits. Endotracheal tube and gastric catheter are seen in satisfactory position. New right jugular central line is noted with the catheter tip in the mid superior vena cava. Persistent and increased bilateral parenchymal opacities are noted most consistent with pulmonary edema. No pneumothorax is noted. IMPRESSION: No pneumothorax following central line placement. Increasing central pulmonary edema. Electronically Signed   By: Inez Catalina M.D.   On: 12/06/2019 03:37   DG Chest Port 1 View  Result Date: 12/06/2019 CLINICAL DATA:  Possible drug overdose EXAM: PORTABLE CHEST 1 VIEW COMPARISON:  None. FINDINGS: Endotracheal tube tip is at the level of the clavicular heads. Enteric tube courses below the field of view. There are hazy bilateral upper lobe predominant opacities, likely pulmonary edema. No pleural effusion or pneumothorax. IMPRESSION: Endotracheal tube tip at the level of the clavicular heads. Bilateral upper lobe predominant opacities, likely pulmonary edema. Electronically Signed   By: Ulyses Jarred M.D.   On: 12/06/2019 02:23    Procedures Procedure Name: Intubation Date/Time: 12/06/2019 2:22 AM Performed by: Orpah Greek, MD Pre-anesthesia Checklist: Patient identified, Emergency Drugs available, Suction available, Patient being monitored and Timeout performed Laryngoscope Size: Glidescope and 3 Grade View: Grade I Tube size: 7.5 mm Number of attempts: 1 Placement Confirmation: ETT inserted through vocal cords under direct vision,  CO2 detector and Breath sounds checked- equal and bilateral Secured at: 24 cm Tube secured with: ETT holder Dental Injury: Teeth and Oropharynx as per pre-operative assessment     .Critical  Care Performed by: Orpah Greek, MD Authorized by: Orpah Greek, MD   Critical care provider statement:    Critical care time (minutes):  40   Critical care was necessary to treat or prevent imminent or life-threatening deterioration of the following conditions:  Respiratory failure and CNS failure or compromise   Critical care was time spent personally by me on the following activities:  Ordering and performing treatments and interventions, ordering and review of laboratory studies, ordering and review of radiographic studies, discussions with consultants, pulse oximetry, evaluation of patient's response to treatment, re-evaluation of patient's condition, examination of patient and gastric intubation   I assumed direction of critical care for this patient from another provider in my specialty: no   Gastrostomy tube replacement  Date/Time: 12/06/2019 4:24 AM Performed by: Orpah Greek, MD Authorized by: Orpah Greek, MD  Consent: The procedure was performed in an emergent situation. Site marked: the operative site was marked Imaging studies: imaging studies available Required items: required blood products, implants, devices, and special equipment available Patient identity confirmed: hospital-assigned identification number Time out: Immediately prior to procedure a "time out" was called to verify  the correct patient, procedure, equipment, support staff and site/side marked as required. Local anesthesia used: no  Anesthesia: Local anesthesia used: no  Sedation: Patient sedated: no  Patient tolerance: patient tolerated the procedure well with no immediate complications  .Central Line  Date/Time: 12/06/2019 8:08 AM Performed by: Orpah Greek, MD Authorized by: Orpah Greek, MD   Consent:    Consent obtained:  Emergent situation Universal protocol:    Relevant documents present and verified: yes     Test results available  and properly labeled: yes     Imaging studies available: yes     Required blood products, implants, devices, and special equipment available: yes     Site/side marked: yes     Immediately prior to procedure, a time out was called: yes     Patient identity confirmed:  Hospital-assigned identification number Pre-procedure details:    Hand hygiene: Hand hygiene performed prior to insertion     Sterile barrier technique: All elements of maximal sterile technique followed     Skin preparation:  2% chlorhexidine   Skin preparation agent: Skin preparation agent completely dried prior to procedure   Anesthesia (see MAR for exact dosages):    Anesthesia method:  None Procedure details:    Location:  R internal jugular   Patient position:  Trendelenburg   Procedural supplies:  Triple lumen   Landmarks identified: yes     Ultrasound guidance: yes     Sterile ultrasound techniques: Sterile gel and sterile probe covers were used     Number of attempts:  1   Successful placement: yes   Post-procedure details:    Post-procedure:  Dressing applied and line sutured   Assessment:  Blood return through all ports, no pneumothorax on x-ray, placement verified by x-ray and free fluid flow   Patient tolerance of procedure:  Tolerated well, no immediate complications   (including critical care time)  Medications Ordered in ED Medications  lactated ringers bolus 1,000 mL (has no administration in time range)  fentaNYL (SUBLIMAZE) 100 MCG/2ML injection (has no administration in time range)  lactated ringers bolus 1,000 mL (has no administration in time range)  norepinephrine (LEVOPHED) 52m in 2559mpremix infusion (6 mcg/min Intravenous Rate/Dose Change 12/06/19 0409)  fentaNYL 250041min NS 250m87m0mc38m) infusion-PREMIX (50 mcg/hr Intravenous New Bag/Given 12/06/19 0406)  docusate sodium (COLACE) capsule 100 mg (has no administration in time range)  polyethylene glycol (MIRALAX / GLYCOLAX) packet 17 g  (has no administration in time range)  insulin aspart (novoLOG) injection 0-9 Units (has no administration in time range)  chlorhexidine gluconate (MEDLINE KIT) (PERIDEX) 0.12 % solution 15 mL (has no administration in time range)  MEDLINE mouth rinse (has no administration in time range)  albuterol (PROVENTIL) (2.5 MG/3ML) 0.083% nebulizer solution 2.5 mg (has no administration in time range)  pantoprazole (PROTONIX) injection 40 mg (has no administration in time range)  0.9 %  sodium chloride infusion (has no administration in time range)  piperacillin-tazobactam (ZOSYN) IVPB 3.375 g (0 g Intravenous Stopped 12/06/19 0418)  0.9 %  sodium chloride infusion ( Intravenous Stopped 12/06/19 0355)  fentaNYL (SUBLIMAZE) injection (200 mcg Intravenous Given 12/06/19 0345)    ED Course  I have reviewed the triage vital signs and the nursing notes.  Pertinent labs & imaging results that were available during my care of the patient were reviewed by me and considered in my medical decision making (see chart for details).    MDM Rules/Calculators/A&P  Patient arrived from a local motel with suspected overdose of unknown drugs, although he was reportedly using cocaine prior to becoming unresponsive.  No additional information was available.  Patient had been intubated by EMS.  Abdominal distention was noted upon arrival.  Patient was being bagged but was also initiating breaths on his own.  A large amount of stomach contents was noted to come up into endotracheal tube and esophageal intubation was confirmed by glide scope.  Patient noted to be bradycardic and hypoxic at this time with heart rate in the 30s and oxygen saturations below 20.  After emergent intubation and vigorous bagging, heart rate and oxygen saturations improved.  Patient remains difficult to oxygenate, sats remaining in the low to mid 80s.  Chest x-ray consistent with pulmonary edema or ARDS.  Cannot rule out aspiration  based on his presentation.  Given Zosyn empirically.  Patient has been persistently difficult to oxygenate.  He has now began to put out bloody frothy fluid more consistent with pulmonary edema.  PEEP increased, frequent suctioning and sats have improved.  Patient persistently hypotensive despite fluid resuscitation.  Central line placed and he was initiated on Levophed with improvement of his blood pressures.  Patient will be admitted to intensive care unit.  Final Clinical Impression(s) / ED Diagnoses Final diagnoses:  Acute respiratory failure with hypoxia [facility])    Rx / DC Orders ED Discharge Orders    None       Deziah Renwick, Gwenyth Allegra, MD 12/06/19 3462    Orpah Greek, MD 12/06/19 [phone]

## 2019-12-06 NOTE — ED Triage Notes (Signed)
Pt arrived by EMS from [facility]; called out for unresponsive, suspected overdose. EMS reported hypotension, pinpoint pupils, and unresponsive. 4.5mg  narcan given pta with no response. Pt arrived intubated pta, large amounts of gastric contents coming from tube, difficulty hearing lung sounds. ETT removed; EDP re-intubated at [phone]

## 2019-12-06 NOTE — Progress Notes (Signed)
RN unable to start tube feeds on patient until after MRI. Upon arrival back to unit, RN put OG tube to low wall suction and 250cc of chunky, brown drainage immediately came out. Dr. Marcos Eke aware and gave verbal order to hold off on tube feeds for tonight and reschedule them for tomorrow.

## 2019-12-06 NOTE — Progress Notes (Signed)
EEG complete - results pending 

## 2019-12-06 NOTE — Procedures (Signed)
Arterial Catheter Insertion Procedure Note  [place] Ss Doe  [id number]  05/31/1876  Date:12/06/19  Time:6:42 AM    Provider Performing: Donnelly Angelica    Procedure: Insertion of Arterial Line (78588) without US guidance  Indication(s) Blood pressure monitoring and/or need for frequent ABGs  Consent Unable to obtain consent due to emergent nature of procedure.  Anesthesia None   Time Out Verified patient identification, verified procedure, site/side was marked, verified correct patient position, special equipment/implants available, medications/allergies/relevant history reviewed, required imaging and test results available.   Sterile Technique Maximal sterile technique including full sterile barrier drape, hand hygiene, sterile gown, sterile gloves, mask, hair covering, sterile ultrasound probe cover (if used).   Procedure Description Area of catheter insertion was cleaned with chlorhexidine and draped in sterile fashion. Without real-time ultrasound guidance an arterial catheter was placed into the right radial artery using a 20g catheter.  Appropriate arterial tracings confirmed on monitor. Normal saline flush used.  Site secured per RT protocol.  Leveled and zeroed.  Good blood flow and waveform.      Complications/Tolerance None; patient tolerated the procedure well.   EBL Minimal   Specimen(s) None

## 2019-12-06 NOTE — Progress Notes (Signed)
Settings changed by MD.

## 2019-12-06 NOTE — Procedures (Addendum)
ELECTROENCEPHALOGRAM REPORT   Patient: Jeffrey [facility]itt       Room #: 1S01U EEG No. ID: [id number] Age: 39 y.o.        Sex: male Requesting Physician: Ardeth Perfect Report Date:  12/06/2019        Interpreting Physician: Thana Farr  History: Jeffrey Merritt is an 39 y.o. male with altered mental status s/p suspected overdose  Medications:  Vancomycin, Zosyn, Insulin, Levophed  Conditions of Recording:  This is a 21 channel routine scalp EEG performed with bipolar and monopolar montages arranged in accordance to the international 10/20 system of electrode placement. One channel was dedicated to EKG recording.  The patient is in the intubated and poorly responsive state.  Description:  The background activity is slow and poorly organized.  It consists of a low voltage, polymorphic delta activity that is diffusely distributed and continuous.  The patient is administered multiple forms of stimulation with no change in the background noted.   No epileptiform activity is noted.   Hyperventilation and intermittent photic stimulation were not performed.  IMPRESSION: This is an abnormal EEG secondary to general background slowing with no evidence of reactivity.  This finding may be seen with a diffuse disturbance that is etiologically nonspecific, but may include a metabolic encephalopathy, among other possibilities.  No epileptiform activity was noted.     Thana Farr, MD Neurology [phone] 12/06/2019, 11:46 AM

## 2019-12-06 NOTE — Progress Notes (Signed)
  Echocardiogram 2D Echocardiogram has been performed.  [facility] 12/06/2019, 1:43 PM

## 2019-12-06 NOTE — Progress Notes (Signed)
CRITICAL VALUE ALERT  Critical Value:  Potassium 6.6  Date & Time Notied:  12/06/2019 ast 3149  Provider Notified: Raymon Mutton, NP     Orders Received/Actions taken: NP will order [facility]

## 2019-12-06 NOTE — Progress Notes (Addendum)
NAME:  Kari Kerth, MRN:  [id number], DOB:  [birth year], LOS: 0 ADMISSION DATE:  12/06/2019, CONSULTATION DATE:  12/06/2019 REFERRING MD:  Dr. Blinda Leatherwood, CHIEF COMPLAINT:  Respiratory failure  Brief History   Unidentified male with suspected overdose with hypoxic respiratory failure and shock.   History of present illness   HPI obtained from ER staff as identity of patient is unknown.    Adult male whom EMS was called to local motel for suspected overdose, possibly cocaine.  No other information available.  Found unresponsive s/p multiple doses of narcan without improvement.  Was intubated by EMS and transported to ER.    On arrival, patient found to be esophageally intubated after noting ongoing hypoxia, significant abdominal distention, initiating breaths on his own, and then large amount of stomach contents coming up ETT.  He became bradycardic with saturations below 20.  He was reintubated by EDP.  He remained significantly hypoxic frothy bloody secretions with CXR consistent with pulmonary edema vs ARDS which have improved with suctioning and PEEP.  Empirically started on zosyn for possible aspiration.  Also persistently hypotensive despite 4L NS now s/p CVL placement and currently on levophed.  Labs noted for Hgb 11.9, glucose 420 with no AG and normal beta-hydroxybutyric acid, BUN 6, sCr 1.58, Lactic acid 6.2, neg acetaminophen/ salicylate level, UDS positive for amphetamines, cocaine, and THC.  PH 7.156/ 68/ 51.  CT head without acute intracranial abnormality.  PCCM called for admission.   Past Medical History  Unknown  Significant Hospital Events   7/8 Admitted  Consults:  Neurology  Procedures:  7/8 ETT >> 7/8 R IJ CVL >>  Significant Diagnostic Tests:  7/8 CTH/ cervical  >> 1. No acute intracranial abnormality. 2. Few sites of mild scalp infiltration over the left temporal and parietal scalp. No large hematoma or calvarial fracture. 3. No evidence of acute fracture or traumatic  listhesis of the cervical spine. 4. Multilevel degenerative changes of the cervical spine, as above.  5. Ground-glass and consolidative opacities in the lungs with some associated septal thickening could reflect interstitial edema though infection or ARDS would present with a similar appearance. 6. Remote appearing right lamina papyracea fracture. Additional remote appearing fracture of the right zygomatic arch and left mandible. Age indeterminate nasal bone deformities, favor remote as well though could correlate for point tenderness. 7. 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  Micro Data:  7/8 BCx 2 >> 7/8 SARS 2 >>negative 7/8 MRSA PCR >>positive  Antimicrobials:  7/8 zosyn >> 7/8 vanc >>  Interim history/subjective:  Pt admitted overnight. Family updated by Dr. Marcos Eke  Objective   Blood pressure 104/71, pulse 80, temperature (!) 93.2 F (34 C), resp. rate (!) 28, height 5\' 9"  (1.753 m), weight 75.1 kg, SpO2 91 %.    Vent Mode: PCV FiO2 (%):  [100 %] 100 % Set Rate:  [20 bmp-28 bmp] 28 bmp Vt Set:  [550 mL-600 mL] 600 mL PEEP:  [10 cmH20-12 cmH20] 12 cmH20 Plateau Pressure:  [20 cmH20-28 cmH20] 28 cmH20   Intake/Output Summary (Last 24 hours) at 12/06/2019 0759 Last data filed at 12/06/2019 0756 Gross per 24 hour  Intake 3860.7 ml  Output 620 ml  Net 3240.7 ml   Filed Weights   12/06/19 0421 12/06/19 0530  Weight: 79.3 kg 75.1 kg    Examination: General:  Critically ill appearing male HEENT: pinpoint pupils, upward gaze, ETT Neuro: not on sedation.  Does not open eyes to voice. Does follow commands--moves toes and left hand--unable to get him to do much with his right hand.  CV: RRR, extremities warm. No LE edema PULM: mechanical breath sounds throughout.  GI: non-distended. Soft. bs active Extremities: warm Skin: no rash  Resolved Hospital Problem list    Assessment & Plan:     Acute hypoxic respiratory failure due to ARDS requiring MV in the setting of suspected OD complicated by pulmonary edema vs aspiration/ ARDS, possible negative pressure edema given hx above Currently on PCV PEEP 12; FiO2 70%; Plateau pressure 27 Plan Continue MV on pressure control per ARDSnet protocol VAP bundle/ PPI  Repeat ABG in 1h  Shock- toxic vs cardiogenic vs septic. No leukocytosis however has been hypothermic since admission.  Levo requirement trending down.  Plan:  Continue levophed for MAP goal >65 F/u echocardiogram to assess cardiogenic component Follow blood cultures Continue broad spectrum coverage with zosyn/ vancomycin   AKI in the setting of shock. Minimal UOP overnight.   AGMA Lactic acidosis resolved Hyperkalemia. K 6.5 this morning Plan:  Repeat BMP at noon Calc gluc for hyperkalemia Strict I/O, daily BMP   Acute encephalopathy- toxic vs metabolic vs hypoxic injury vs seizure. Head CT on admission negative for acute findings. Ammonia normal He is able to follow some commands this morning however neuro exam remains concerning Polysubstance use. UDS positive for cocaine, THC, amphetamines; ETOH neg Plan:  F/u EEG Will consult neurology for further evaluation and possible brain MRI C-spine MRI to r/o ligamentous injury--if neg, should be able to remove C-collar F/u [facility] send out urine for fentanyl/ norfentanyl Continue sedation with prn fentanyl RAAS goal 0 to -1  Normocytic anemia. hgb downtrending since admission from 14>12. No obvious signs of active bleeding at this time however will continue to monitor.  Hyperglycemia resolved. CBG monitoring with SSI.  High risk malnutrition. Nutrition consulted to start TF  Best practice:  Diet: TF Pain/Anxiety/Delirium protocol (if indicated): prn fentanyl, RASS goal 0/-1 VAP protocol (if indicated): yes DVT prophylaxis: SCDs, heparin GI prophylaxis: PPI Glucose control: SSI Mobility: BR Code Status:  Full  Family Communication: family at bedside earlier this morning Disposition: admit ICU  Labs   CBC: Recent Labs  Lab 12/06/19 0152 12/06/19 0201 12/06/19 0236 12/06/19 0654  WBC  --  9.4  --   --   NEUTROABS  --  5.7  --   --   HGB 13.9  13.9 11.9* 11.6* 13.6  HCT 41.0  41.0 38.8* 34.0* 40.0  MCV  --  90.9  --   --   PLT  --  359  --   --     Basic Metabolic Panel: Recent Labs  Lab 12/06/19 0152 12/06/19 0201 12/06/19 0236 12/06/19 0654  NA 136  136 134* 138 141  K 4.2  4.2 4.1 4.4 6.5*  CL 101  101 102  --   --   CO2  --  22  --   --   GLUCOSE 367*  367* 420*  --   --   BUN 6  6* 6  --   --   CREATININE 1.40*  1.40* 1.58*  --   --   CALCIUM  --  8.3*  --   --    GFR: Estimated Creatinine Clearance: 62.8 mL/min (A) (by C-G formula based on SCr of 1.58 mg/dL (H)). Recent Labs  Lab 12/06/19 0201 12/06/19 0237 12/06/19 0424  WBC 9.4  --   --  LATICACIDVEN  --  6.2* 1.2    Liver Function Tests: Recent Labs  Lab 12/06/19 0201  AST 32  ALT 23  ALKPHOS 66  BILITOT 0.5  PROT 5.2*  ALBUMIN 2.5*   No results for input(s): LIPASE, AMYLASE in the last 168 hours. Recent Labs  Lab 12/06/19 0337  AMMONIA 18    ABG    Component Value Date/Time   PHART 7.220 (L) 12/06/2019 0654   PCO2ART 54.1 (H) 12/06/2019 0654   PO2ART 53 (L) 12/06/2019 0654   HCO3 23.1 12/06/2019 0654   TCO2 25 12/06/2019 0654   ACIDBASEDEF 6.0 (H) 12/06/2019 0654   O2SAT 86.0 12/06/2019 0654     Toiya Morrish, MD Internal Medicine Resident PGY 2 12/06/19 8:57 AM

## 2019-12-06 NOTE — Progress Notes (Signed)
Pharmacy Antibiotic Note  Jeffrey Merritt is a 39 y.o. male admitted on 12/06/2019 after being found unresponsive for suspected overdose.  Pharmacy has been consulted for vancomycin dosing for sepsis. Patient originally presented as a doe and received vancomycin 1500mg  IV loading dose was planned to be started on vancomycin 750 mg IV q12h based on estimated age.   Patient has been identified and current CrCl is ~82 ml/min. Scr 1.40 on admission decreased to 1.20. Baseline Scr unknown. Wbc wnl. Afebrile.   Plan: Adjust planned maintenance regimen to vancomycin 750mg  IV q8h for a goal trough 15-20 mcg/ml Monitor renal function closely, cultures/sensitivities and clinical progression  Height: 5\' 9"  (175.3 cm) Weight: 75.1 kg (165 lb 9.1 oz) IBW/kg (Calculated) : 70.7  Temp (24hrs), Avg:92.8 F (33.8 C), Min:92.5 F (33.6 C), Max:93.7 F (34.3 C)  Recent Labs  Lab 12/06/19 0152 12/06/19 0201 12/06/19 0237 12/06/19 0424 12/06/19 0652  WBC  --  9.4  --   --   --   CREATININE 1.40*  1.40* 1.58*  --   --  1.20  LATICACIDVEN  --   --  6.2* 1.2 0.9    Estimated Creatinine Clearance: 82.6 mL/min (by C-G formula based on SCr of 1.2 mg/dL).    Allergies  Allergen Reactions  . Cogentin [Benztropine] Anaphylaxis  . Risperidone And Related Anaphylaxis    Antimicrobials this admission: Vancomycin 7/8 >> Zosyn 7/8 >>   Dose adjustments this admission: 7/8: vancomycin 750 q12h changed to 750mg  IV q8h  Microbiology results: 7/8 BCx:  7/8 MRSA PCR: positive   Thank you for allowing pharmacy to be a part of this patient's care.  02/06/20, PharmD Clinical Pharmacist  12/06/2019 8:59 AM

## 2019-12-06 NOTE — Progress Notes (Signed)
Pharmacy Antibiotic Note  [place] Ss Doe is a 39 y.o. male admitted on 12/06/2019 with overdose >> concern for shock.  Pharmacy has been consulted for vancomycin dosing.  Pt's identity unknown; estimated age 47-50 >> est CrCl 55-65 ml/min.  Plan: Vancomycin 1500mg  x1 then 750mg  IV every 12 hours.  Goal trough 15-20 mcg/mL.  Weight: 79.3 kg (174 lb 13.2 oz)  Temp (24hrs), Avg:92.8 F (33.8 C), Min:92.6 F (33.7 C), Max:93.3 F (34.1 C)  Recent Labs  Lab 12/06/19 0152 12/06/19 0201 12/06/19 0237  WBC  --  9.4  --   CREATININE 1.40* 1.58*  --   LATICACIDVEN  --   --  6.2*     Thank you for allowing pharmacy to be a part of this patient's care.  02/06/20, PharmD, BCPS  12/06/2019 4:45 AM

## 2019-12-06 NOTE — Plan of Care (Signed)
  Problem: Elimination: Goal: Will not experience complications related to bowel motility Outcome: Progressing Goal: Will not experience complications related to urinary retention Outcome: Progressing   Problem: Pain Managment: Goal: General experience of comfort will improve Outcome: Progressing   Problem: Skin Integrity: Goal: Risk for impaired skin integrity will decrease Outcome: Progressing   

## 2019-12-06 NOTE — Progress Notes (Signed)
Initial Nutrition Assessment  DOCUMENTATION CODES:   Not applicable  INTERVENTION:   Initiate tube feeds via OG tube: - Vital 1.5 @ 55 ml/hr (1320 ml/day) - ProSource Tube Feeding liquid protein 45 ml daily  Tube feeding regimen provides 2020 kcal, 100 grams of protein, and 1008 ml of H2O.  NUTRITION DIAGNOSIS:   Inadequate oral intake related to inability to eat as evidenced by NPO status.  GOAL:   Provide needs based on ASPEN/SCCM guidelines  MONITOR:   Vent status, Labs, Weight trends, TF tolerance  REASON FOR ASSESSMENT:   Ventilator    ASSESSMENT:   39 year old male who presented on 7/08 after being found unresponsive at local motel with suspected overdose. UDS positive for amphetamines, cocaine, THC. Pt required intubation CXR consistent with ARDS vs pulmonary edema. Pt developed shock and required levophed.   Discussed pt with RN and during ICU rounds. Consult received for tube feeding initiation and management.  OG tube in place.  Patient is currently intubated on ventilator support MV: 15.3 L/min Temp (24hrs), Avg:93 F (33.9 C), Min:92.5 F (33.6 C), Max:98.1 F (36.7 C) BP (a-line): 98/52 MAP (a-line): 65  Drips: Propofol: none Levophed: 30 ml/hr NS: 100 ml/hr  Medications reviewed and include: folic acid, SSI q 4 hours, protonix, thiamine, IV calcium gluconate 1 gram once, IV abx  Labs reviewed: potassium 6.5 CBG's: 85-96  NUTRITION - FOCUSED PHYSICAL EXAM:    Most Recent Value  Orbital Region No depletion  Upper Arm Region No depletion  Thoracic and Lumbar Region No depletion  Buccal Region Unable to assess  Temple Region No depletion  Clavicle Bone Region No depletion  Clavicle and Acromion Bone Region No depletion  Scapular Bone Region Unable to assess  Dorsal Hand No depletion  Patellar Region No depletion  Anterior Thigh Region No depletion  Posterior Calf Region No depletion  Edema (RD Assessment) None  Hair Reviewed  Eyes  Unable to assess  Mouth Unable to assess  Skin Reviewed  Nails Reviewed       Diet Order:   Diet Order            Diet NPO time specified  Diet effective now                 EDUCATION NEEDS:   No education needs have been identified at this time  Skin:  Skin Assessment: Reviewed RN Assessment  Last BM:  no documented BM  Height:   Ht Readings from Last 1 Encounters:  12/06/19 5\' 9"  (1.753 m)    Weight:   Wt Readings from Last 1 Encounters:  12/06/19 75.1 kg    Ideal Body Weight:  72.7 kg  BMI:  Body mass index is 24.45 kg/m.  Estimated Nutritional Needs:   Kcal:  2034  Protein:  100-115 grams  Fluid:  >/= 2.0 L    2035, MS, RD, LDN Inpatient Clinical Dietitian Please see AMiON for contact information.

## 2019-12-07 ENCOUNTER — Inpatient Hospital Stay (HOSPITAL_COMMUNITY): Payer: Medicare Other

## 2019-12-07 LAB — POCT I-STAT 7, (LYTES, BLD GAS, ICA,H+H)
Acid-base deficit: 3 mmol/L — ABNORMAL HIGH (ref 0.0–2.0)
Bicarbonate: 22.5 mmol/L (ref 20.0–28.0)
Calcium, Ion: 1.21 mmol/L (ref 1.15–1.40)
HCT: 30 % — ABNORMAL LOW (ref 39.0–52.0)
Hemoglobin: 10.2 g/dL — ABNORMAL LOW (ref 13.0–17.0)
O2 Saturation: 99 %
Patient temperature: 36
Potassium: 3.5 mmol/L (ref 3.5–5.1)
Sodium: 140 mmol/L (ref 135–145)
TCO2: 24 mmol/L (ref 22–32)
pCO2 arterial: 37.9 mmHg (ref 32.0–48.0)
pH, Arterial: 7.377 (ref 7.350–7.450)
pO2, Arterial: 132 mmHg — ABNORMAL HIGH (ref 83.0–108.0)

## 2019-12-07 LAB — BASIC METABOLIC PANEL
Anion gap: 10 (ref 5–15)
BUN: 11 mg/dL (ref 6–20)
CO2: 19 mmol/L — ABNORMAL LOW (ref 22–32)
Calcium: 8 mg/dL — ABNORMAL LOW (ref 8.9–10.3)
Chloride: 108 mmol/L (ref 98–111)
Creatinine, Ser: 1.09 mg/dL (ref 0.61–1.24)
GFR calc Af Amer: >60 mL/min (ref >60)
GFR calc non Af Amer: >60 mL/min (ref >60)
Glucose, Bld: 127 mg/dL — ABNORMAL HIGH (ref 70–99)
Potassium: 3.4 mmol/L — ABNORMAL LOW (ref 3.5–5.1)
Sodium: 137 mmol/L (ref 135–145)

## 2019-12-07 LAB — GLUCOSE, CAPILLARY
Glucose-Capillary: 89 mg/dL (ref 70–99)
Glucose-Capillary: 94 mg/dL (ref 70–99)
Glucose-Capillary: 100 mg/dL — ABNORMAL HIGH (ref 70–99)
Glucose-Capillary: 93 mg/dL (ref 70–99)
Glucose-Capillary: 99 mg/dL (ref 70–99)
Glucose-Capillary: 94 mg/dL (ref 70–99)

## 2019-12-07 LAB — TRIGLYCERIDES: Triglycerides: 134 mg/dL (ref <150)

## 2019-12-07 LAB — CBC
HCT: 33.9 % — ABNORMAL LOW (ref 39.0–52.0)
Hemoglobin: 11.2 g/dL — ABNORMAL LOW (ref 13.0–17.0)
MCH: 28.9 pg (ref 26.0–34.0)
MCHC: 33 g/dL (ref 30.0–36.0)
MCV: 87.4 fL (ref 80.0–100.0)
Platelets: 248 10*3/uL (ref 150–400)
RBC: 3.88 MIL/uL — ABNORMAL LOW (ref 4.22–5.81)
RDW: 13.3 % (ref 11.5–15.5)
WBC: 17.5 10*3/uL — ABNORMAL HIGH (ref 4.0–10.5)
nRBC: 0 % (ref 0.0–0.2)

## 2019-12-07 LAB — MAGNESIUM
Magnesium: 1.7 mg/dL (ref 1.7–2.4)
Magnesium: 1.9 mg/dL (ref 1.7–2.4)

## 2019-12-07 LAB — PHOSPHORUS
Phosphorus: 2 mg/dL — ABNORMAL LOW (ref 2.5–4.6)
Phosphorus: 2.4 mg/dL — ABNORMAL LOW (ref 2.5–4.6)

## 2019-12-07 MED ORDER — MIDAZOLAM 50MG/50ML (1MG/ML) PREMIX INFUSION
0.5000 mg/h | INTRAVENOUS | Status: DC
  Administered 2019-12-07: 2 mg/h via INTRAVENOUS
  Administered 2019-12-07 – 2019-12-08 (×2): 6 mg/h via INTRAVENOUS
  Administered 2019-12-08: 5 mg/h via INTRAVENOUS
  Administered 2019-12-09: 6 mg/h via INTRAVENOUS
  Administered 2019-12-09: 7 mg/h via INTRAVENOUS
  Administered 2019-12-09 (×2): 5 mg/h via INTRAVENOUS
  Administered 2019-12-10 – 2019-12-11 (×5): 7 mg/h via INTRAVENOUS
  Filled 2019-12-07 (×12): qty 50

## 2019-12-07 MED ORDER — FENTANYL CITRATE (PF) 100 MCG/2ML IJ SOLN
25.0000 ug | INTRAMUSCULAR | Status: DC | PRN
  Administered 2019-12-07 – 2019-12-08 (×3): 25 ug via INTRAVENOUS
  Administered 2019-12-08: 100 ug via INTRAVENOUS
  Administered 2019-12-08 – 2019-12-11 (×4): 25 ug via INTRAVENOUS
  Filled 2019-12-07 (×2): qty 2

## 2019-12-07 MED ORDER — POTASSIUM PHOSPHATES 15 MMOLE/5ML IV SOLN
20.0000 mmol | Freq: Once | INTRAVENOUS | Status: AC
  Administered 2019-12-07: 20 mmol via INTRAVENOUS
  Filled 2019-12-07: qty 6.67

## 2019-12-07 MED ORDER — SODIUM CHLORIDE 0.9 % IV SOLN
0.5000 mg/kg/h | INTRAVENOUS | Status: DC
  Filled 2019-12-07: qty 5

## 2019-12-07 MED ORDER — FENTANYL 2500MCG IN NS 250ML (10MCG/ML) PREMIX INFUSION
0.0000 ug/h | INTRAVENOUS | Status: DC
  Administered 2019-12-07: 100 ug/h via INTRAVENOUS
  Administered 2019-12-07: 200 ug/h via INTRAVENOUS
  Administered 2019-12-08: 400 ug/h via INTRAVENOUS
  Administered 2019-12-09: 350 ug/h via INTRAVENOUS
  Administered 2019-12-09 (×2): 400 ug/h via INTRAVENOUS
  Administered 2019-12-09: 350 ug/h via INTRAVENOUS
  Administered 2019-12-10: 275 ug/h via INTRAVENOUS
  Administered 2019-12-10 (×2): 325 ug/h via INTRAVENOUS
  Administered 2019-12-11 (×2): 350 ug/h via INTRAVENOUS
  Filled 2019-12-07 (×14): qty 250

## 2019-12-07 MED ORDER — MAGNESIUM SULFATE 2 GM/50ML IV SOLN
2.0000 g | Freq: Once | INTRAVENOUS | Status: AC
  Administered 2019-12-07: 2 g via INTRAVENOUS
  Filled 2019-12-07: qty 50

## 2019-12-07 MED ORDER — FENTANYL CITRATE (PF) 100 MCG/2ML IJ SOLN
100.0000 ug | Freq: Once | INTRAMUSCULAR | Status: AC
  Administered 2019-12-07: 100 ug via INTRAVENOUS

## 2019-12-07 MED ORDER — QUETIAPINE FUMARATE 50 MG PO TABS
25.0000 mg | ORAL_TABLET | Freq: Two times a day (BID) | ORAL | Status: DC
  Administered 2019-12-07 – 2019-12-08 (×4): 25 mg via ORAL
  Filled 2019-12-07 (×4): qty 1

## 2019-12-07 NOTE — Progress Notes (Signed)
NAME:  Jeffrey Merritt, MRN:  [id number], DOB:  Sep 28, 1980, LOS: 1 ADMISSION DATE:  12/06/2019, CONSULTATION DATE:  12/06/2019 REFERRING MD:  Dr. Blinda Leatherwood, CHIEF COMPLAINT:  Respiratory failure  Brief History   Unidentified male with suspected overdose with hypoxic respiratory failure and shock.   History of present illness   HPI obtained from ER staff as identity of patient is unknown.    Adult male whom EMS was called to local motel for suspected overdose, possibly cocaine.  No other information available.  Found unresponsive s/p multiple doses of narcan without improvement.  Was intubated by EMS and transported to ER.    On arrival, patient found to be esophageally intubated after noting ongoing hypoxia, significant abdominal distention, initiating breaths on his own, and then large amount of stomach contents coming up ETT.  He became bradycardic with saturations below 20.  He was reintubated by EDP.  He remained significantly hypoxic frothy bloody secretions with CXR consistent with pulmonary edema vs ARDS which have improved with suctioning and PEEP.  Empirically started on zosyn for possible aspiration.  Also persistently hypotensive despite 4L NS now s/p CVL placement and currently on levophed.  Labs noted for Hgb 11.9, glucose 420 with no AG and normal beta-hydroxybutyric acid, BUN 6, sCr 1.58, Lactic acid 6.2, neg acetaminophen/ salicylate level, UDS positive for amphetamines, cocaine, and THC.  PH 7.156/ 68/ 51.  CT head without acute intracranial abnormality.  PCCM called for admission.   Past Medical History  Unknown  Significant Hospital Events   7/8 Admitted  Consults:  Neurology  Procedures:  7/8 ETT >> 7/8 R IJ CVL >>  Significant Diagnostic Tests:  7/8 CTH/ cervical  >> 1. No acute intracranial abnormality. 2. Few sites of mild scalp infiltration over the left temporal and parietal scalp. No large hematoma or calvarial fracture. 3. No evidence of acute fracture or traumatic  listhesis of the cervical spine. 4. Multilevel degenerative changes of the cervical spine, as above.  5. Ground-glass and consolidative opacities in the lungs with some associated septal thickening could reflect interstitial edema though infection or ARDS would present with a similar appearance. 6. Remote appearing right lamina papyracea fracture. Additional remote appearing fracture of the right zygomatic arch and left mandible. Age indeterminate nasal bone deformities, favor remote as well though could correlate for point tenderness. 7. 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  Micro Data:  7/8 BCx 2 >> 7/8 SARS 2 >>negative 7/8 MRSA PCR >>positive  Antimicrobials:  7/8 zosyn >> 7/8 vanc >>  Interim history/subjective:  Pt admitted overnight. Family updated by Dr. Marcos Eke  Objective   Blood pressure (!) 93/57, pulse (!) 115, temperature 97.7 F (36.5 C), resp. rate (!) 32, height 5\' 9"  (1.753 m), weight 75.6 kg, SpO2 100 %. CVP:  [6 mmHg-13 mmHg] 13 mmHg  Vent Mode: PCV FiO2 (%):  [40 %-80 %] 40 % Set Rate:  [28 bmp-32 bmp] 32 bmp PEEP:  [12 cmH20] 12 cmH20 Plateau Pressure:  [27 cmH20-28 cmH20] 28 cmH20   Intake/Output Summary (Last 24 hours) at 12/07/2019 0654 Last data filed at 12/07/2019 0500 Gross per 24 hour  Intake 1867.54 ml  Output 1935 ml  Net -67.46 ml   Filed Weights   12/06/19 0421 12/06/19 0530 12/07/19 0500  Weight: 79.3 kg 75.1 kg 75.6 kg    Examination: General:  Critically ill appearing male HEENT: pinpoint pupils, upward gaze, ETT Neuro: not  on sedation. Does not open eyes to voice. Does follow commands--moves toes and left hand--unable to get him to do much with his right hand.  CV: RRR, extremities warm. No LE edema PULM: mechanical breath sounds throughout.  GI: non-distended. Soft. bs active Extremities: warm Skin: no rash  Resolved Hospital Problem list     AKI  Shock  Assessment & Plan:   Acute encephalopathy Polysubstance use. Likely form drug overdose, have sent fentanyl /norfentanyl screen.   Head CT on admission negative for acute findings. Ammonia normal MRI brain normal.  He is able to follow some commands this morning  UDS positive for cocaine, THC, amphetamines; ETOH neg Plan: F/u [facility] send out urine for fentanyl/ norfentanyl Continue sedation with prn fentanyl RAAS goal 0 to -1  Acute hypoxic respiratory failure due to ARDS requiring MV This is in the setting of suspected OD and Aspiration.Currently on PCV PEEP 12; FiO2 40%; Plateau pressure 27 Plan Continue MV on pressure control per ARDSnet protocol VAP bundle/ PPI  Repeat ABG this morning and discuss extubation if continuing to improve.  Leukocytosis  Shock (resolved) toxic vs septic.  Off levo overnight, MAP ~70  Echo with EF 50-55%, not able to access for wall motion abnormalities Febrile overnight with WBC trending up 17.5. Blood cultures NGTD Plan:  MAP goal >65 Follow blood cultures Continue Zosyn Stop Vanc  NAGMA  Hypokalemia, hypophosphatemia  Plan:  Replete lytes  Strict I/O, daily BMP   Normocytic anemia. hgb downtrending since admission from 14>12>11 with fluids. Hct also downtrending. No obvious signs of active bleeding at this time however will continue to monitor.  Hyperglycemia resolved. CBG monitoring with SSI.  High risk malnutrition. Nutrition consulted to start TF, schedule to start today.   Best practice:  Diet: TF Pain/Anxiety/Delirium protocol (if indicated): prn fentanyl, RASS goal 0/-1 VAP protocol (if indicated): yes DVT prophylaxis: SCDs, heparin GI prophylaxis: PPI Glucose control: SSI Mobility: BR Code Status: Full  Family Communication:  Disposition: admit ICU  Labs   CBC: Recent Labs  Lab 12/06/19 0201 12/06/19 0201 12/06/19 0236 12/06/19 0654 12/06/19 1003 12/06/19 1222 12/07/19 0520  WBC 9.4  --   --    --   --   --  17.5*  NEUTROABS 5.7  --   --   --   --   --   --   HGB 11.9*   < > 11.6* 13.6 12.9* 12.2* 11.2*  HCT 38.8*   < > 34.0* 40.0 38.0* 36.0* 33.9*  MCV 90.9  --   --   --   --   --  87.4  PLT 359  --   --   --   --   --  248   < > = values in this interval not displayed.    Basic Metabolic Panel: Recent Labs  Lab 12/06/19 0152 12/06/19 0152 12/06/19 0201 12/06/19 0236 12/06/19 0814 12/06/19 0654 12/06/19 1003 12/06/19 1139 12/06/19 1222 12/06/19 1718 12/07/19 0520  NA 136  136   < > 134*   < > 138 141 142 138 142  --   --   K 4.2  4.2   < > 4.1   < > 6.6* 6.5* 5.4* 5.0 4.8  --   --   CL 101  101  --  102  --  113*  --   --  111  --   --   --   CO2  --   --  22  --  22  --   --  22  --   --   --   GLUCOSE 367*  367*  --  420*  --  108*  --   --  131*  --   --   --   BUN 6  6*  --  6  --  8  --   --  10  --   --   --   CREATININE 1.40*  1.40*  --  1.58*  --  1.20  --   --  1.07  --   --   --   CALCIUM  --   --  8.3*  --  7.4*  --   --  7.4*  --   --   --   MG  --   --   --   --   --   --   --   --   --  1.7 1.7  PHOS  --   --   --   --   --   --   --   --   --  3.1 2.0*   < > = values in this interval not displayed.   GFR: Estimated Creatinine Clearance: 92.7 mL/min (by C-G formula based on SCr of 1.07 mg/dL). Recent Labs  Lab 12/06/19 0201 12/06/19 0237 12/06/19 0424 12/06/19 0652 12/07/19 0520  PROCALCITON  --   --   --  5.80  --   WBC 9.4  --   --   --  17.5*  LATICACIDVEN  --  6.2* 1.2 0.9  --     Liver Function Tests: Recent Labs  Lab 12/06/19 0201 12/06/19 0652  AST 32 39  ALT 23 31  ALKPHOS 66 61  BILITOT 0.5 0.6  PROT 5.2* 5.6*  ALBUMIN 2.5* 2.6*   No results for input(s): LIPASE, AMYLASE in the last 168 hours. Recent Labs  Lab 12/06/19 0337  AMMONIA 18    ABG    Component Value Date/Time   PHART 7.224 (L) 12/06/2019 1222   PCO2ART 56.1 (H) 12/06/2019 1222   PO2ART 129 (H) 12/06/2019 1222   HCO3 23.3 12/06/2019 1222    TCO2 25 12/06/2019 1222   ACIDBASEDEF 5.0 (H) 12/06/2019 1222   O2SAT 98.0 12/06/2019 1222     Milus Banister, MD Internal Medicine Resident PGY 2 12/07/19 6:54 AM

## 2019-12-07 NOTE — Progress Notes (Signed)
eLink Physician-Brief Progress Note Patient Name: Jeffrey Merritt DOB: October 31, 1980 MRN: [id number]   Date of Service  12/07/2019  HPI/Events of Note  Patient with episodic extreme agitation on the ventilator despite Propofol sedation. Most recent QTc  0.476.  eICU Interventions  Fentanyl 25 mg iv Q2 ours prn agitation ordered, along with Seroquel 25 mg po bid.        Thomasene Lot Rorik Vespa 12/07/2019, 4:52 AM

## 2019-12-07 NOTE — Progress Notes (Signed)
Patient severely agitated: RASS +4. Pulling at lines and tubes, attempting to strike staff. Not following commands but eyes open. Not tracking. Dr. Jayme Cloud to bedside - Fentanyl given + started Fentanyl drip.   Rise Mu BSN RN 10M/44M MICU

## 2019-12-07 NOTE — Progress Notes (Signed)
eLink Physician-Brief Progress Note Patient Name: Jeffrey Merritt DOB: Feb 20, 1981 MRN: [id number]   Date of Service  12/07/2019  HPI/Events of Note  KUB completely benign.  eICU Interventions  Resume enteral nutrition at half previous rate and slowly advance back to prior goal rate over the course of this shift.        Thomasene Lot Bayler Gehrig 12/07/2019, 10:27 PM

## 2019-12-07 NOTE — Progress Notes (Signed)
Elink made aware of patients residuals being 550 through OGT. Stomach is taut with hypoactive bowel sounds.  Tube feeds placed on hold.  Awaiting a KUB.

## 2019-12-07 NOTE — Progress Notes (Signed)
RT note. Vent changes discussed with NP Whitney from ABG results. ABG: 7.37, Co2 37.9, Pao2 132. RR change to 24, Peep change to 8. RT will continue to monitor and reassess.

## 2019-12-08 ENCOUNTER — Inpatient Hospital Stay (HOSPITAL_COMMUNITY): Payer: Medicare Other

## 2019-12-08 LAB — POCT I-STAT 7, (LYTES, BLD GAS, ICA,H+H)
Acid-Base Excess: 1 mmol/L (ref 0.0–2.0)
Bicarbonate: 25 mmol/L (ref 20.0–28.0)
Calcium, Ion: 1.2 mmol/L (ref 1.15–1.40)
HCT: 28 % — ABNORMAL LOW (ref 39.0–52.0)
Hemoglobin: 9.5 g/dL — ABNORMAL LOW (ref 13.0–17.0)
O2 Saturation: 100 %
Patient temperature: 37.5
Potassium: 3.5 mmol/L (ref 3.5–5.1)
Sodium: 140 mmol/L (ref 135–145)
TCO2: 26 mmol/L (ref 22–32)
pCO2 arterial: 38.2 mmHg (ref 32.0–48.0)
pH, Arterial: 7.426 (ref 7.350–7.450)
pO2, Arterial: 345 mmHg — ABNORMAL HIGH (ref 83.0–108.0)

## 2019-12-08 LAB — GLUCOSE, CAPILLARY
Glucose-Capillary: 105 mg/dL — ABNORMAL HIGH (ref 70–99)
Glucose-Capillary: 115 mg/dL — ABNORMAL HIGH (ref 70–99)
Glucose-Capillary: 117 mg/dL — ABNORMAL HIGH (ref 70–99)
Glucose-Capillary: 87 mg/dL (ref 70–99)
Glucose-Capillary: 88 mg/dL (ref 70–99)
Glucose-Capillary: 95 mg/dL (ref 70–99)
Glucose-Capillary: 97 mg/dL (ref 70–99)

## 2019-12-08 LAB — CULTURE, RESPIRATORY W GRAM STAIN: Culture: NORMAL

## 2019-12-08 LAB — CBC
HCT: 28.1 % — ABNORMAL LOW (ref 39.0–52.0)
Hemoglobin: 9.2 g/dL — ABNORMAL LOW (ref 13.0–17.0)
MCH: 28.6 pg (ref 26.0–34.0)
MCHC: 32.7 g/dL (ref 30.0–36.0)
MCV: 87.3 fL (ref 80.0–100.0)
Platelets: 220 10*3/uL (ref 150–400)
RBC: 3.22 MIL/uL — ABNORMAL LOW (ref 4.22–5.81)
RDW: 13.4 % (ref 11.5–15.5)
WBC: 12.9 10*3/uL — ABNORMAL HIGH (ref 4.0–10.5)
nRBC: 0 % (ref 0.0–0.2)

## 2019-12-08 LAB — BASIC METABOLIC PANEL
Anion gap: 6 (ref 5–15)
BUN: 6 mg/dL (ref 6–20)
CO2: 24 mmol/L (ref 22–32)
Calcium: 7.9 mg/dL — ABNORMAL LOW (ref 8.9–10.3)
Chloride: 107 mmol/L (ref 98–111)
Creatinine, Ser: 0.85 mg/dL (ref 0.61–1.24)
GFR calc Af Amer: >60 mL/min (ref >60)
GFR calc non Af Amer: >60 mL/min (ref >60)
Glucose, Bld: 116 mg/dL — ABNORMAL HIGH (ref 70–99)
Potassium: 3.3 mmol/L — ABNORMAL LOW (ref 3.5–5.1)
Sodium: 137 mmol/L (ref 135–145)

## 2019-12-08 LAB — MAGNESIUM: Magnesium: 1.7 mg/dL (ref 1.7–2.4)

## 2019-12-08 LAB — TRIGLYCERIDES: Triglycerides: 142 mg/dL (ref <150)

## 2019-12-08 LAB — PHOSPHORUS: Phosphorus: 1.1 mg/dL — ABNORMAL LOW (ref 2.5–4.6)

## 2019-12-08 MED ORDER — ZIPRASIDONE MESYLATE 20 MG IM SOLR
20.0000 mg | INTRAMUSCULAR | Status: AC
  Filled 2019-12-08 (×2): qty 20

## 2019-12-08 MED ORDER — MIDAZOLAM HCL 2 MG/2ML IJ SOLN
INTRAMUSCULAR | Status: AC
  Administered 2019-12-08: 2 mg
  Filled 2019-12-08: qty 2

## 2019-12-08 MED ORDER — LORAZEPAM 2 MG/ML IJ SOLN
4.0000 mg | Freq: Once | INTRAMUSCULAR | Status: AC
  Administered 2019-12-08: 4 mg via INTRAVENOUS

## 2019-12-08 MED ORDER — MAGNESIUM SULFATE 2 GM/50ML IV SOLN
2.0000 g | Freq: Once | INTRAVENOUS | Status: AC
  Administered 2019-12-08: 2 g via INTRAVENOUS
  Filled 2019-12-08: qty 50

## 2019-12-08 MED ORDER — DEXTROSE 50 % IV SOLN
INTRAVENOUS | Status: AC
  Administered 2019-12-08: 25 mL
  Filled 2019-12-08: qty 50

## 2019-12-08 MED ORDER — DEXMEDETOMIDINE HCL IN NACL 400 MCG/100ML IV SOLN
0.4000 ug/kg/h | INTRAVENOUS | Status: DC
  Administered 2019-12-08: 0.4 ug/kg/h via INTRAVENOUS
  Filled 2019-12-08: qty 100

## 2019-12-08 MED ORDER — LORAZEPAM 2 MG/ML IJ SOLN
INTRAMUSCULAR | Status: AC
  Filled 2019-12-08: qty 2

## 2019-12-08 MED ORDER — FENTANYL CITRATE (PF) 100 MCG/2ML IJ SOLN
INTRAMUSCULAR | Status: AC
  Filled 2019-12-08: qty 4

## 2019-12-08 MED ORDER — SODIUM PHOSPHATES 45 MMOLE/15ML IV SOLN
30.0000 mmol | Freq: Once | INTRAVENOUS | Status: AC
  Administered 2019-12-08: 30 mmol via INTRAVENOUS
  Filled 2019-12-08: qty 10

## 2019-12-08 MED ORDER — POTASSIUM CHLORIDE 20 MEQ/15ML (10%) PO SOLN
40.0000 meq | Freq: Every day | ORAL | Status: DC
  Administered 2019-12-08: 40 meq via ORAL
  Filled 2019-12-08: qty 30

## 2019-12-08 MED ORDER — ROCURONIUM BROMIDE 50 MG/5ML IV SOLN
100.0000 mg | Freq: Once | INTRAVENOUS | Status: AC
  Administered 2019-12-08: 100 mg via INTRAVENOUS
  Filled 2019-12-08: qty 10

## 2019-12-08 MED ORDER — ALBUTEROL SULFATE (2.5 MG/3ML) 0.083% IN NEBU: 2.5000 mg | INHALATION_SOLUTION | RESPIRATORY_TRACT | Status: DC | PRN

## 2019-12-08 MED ORDER — LORAZEPAM 2 MG/ML IJ SOLN
INTRAMUSCULAR | Status: AC
  Administered 2019-12-08: 4 mg
  Filled 2019-12-08: qty 2

## 2019-12-08 MED ORDER — NALOXONE HCL 0.4 MG/ML IJ SOLN
INTRAMUSCULAR | Status: AC
  Filled 2019-12-08: qty 1

## 2019-12-08 NOTE — Procedures (Signed)
Extubation Procedure Note  Patient Details:   Name: Jeffrey Merritt DOB: 1981-03-06 MRN: [id number]   Airway Documentation:    Vent end date: 12/08/19 Vent end time: 1040   Evaluation  O2 sats: currently acceptable Complications: No apparent complications Patient did tolerate procedure well. Bilateral Breath Sounds: Diminished   Yes   Patient extubated to nasal cannula per MD order.  Positive cuff leak noted.  No evidence of stridor.  When ETT was pulled patient's sats did drop to 70s and increased [place] from 2L to 6L.  Patient still currently slowly waking up.  Sats are at 100% at this time with no respiratory distress.  Will continue to monitor.  Elyn Peers 12/08/2019, 10:50 AM

## 2019-12-08 NOTE — Procedures (Signed)
Intubation Procedure Note  Welcome Fults  [id number]  1980-08-14  Date:12/08/19  Time:11:36 AM   Provider Performing:Aarnav Steagall C Katrinka Blazing    Procedure: Intubation (31500)  Indication(s) Respiratory Failure  Consent Unable to obtain consent due to emergent nature of procedure.   Anesthesia Versed, Fentanyl, Rocuronium and Propofol   Time Out Verified patient identification, verified procedure, site/side was marked, verified correct patient position, special equipment/implants available, medications/allergies/relevant history reviewed, required imaging and test results available.   Sterile Technique Usual hand hygeine, masks, and gloves were used   Procedure Description Patient positioned in bed supine.  Sedation given as noted above.  Patient was intubated with endotracheal tube using Glidescope.  View was Grade 1 full glottis .  Number of attempts was 1.  Colorimetric CO2 detector was consistent with tracheal placement.   Complications/Tolerance None; patient tolerated the procedure well. Chest X-ray is ordered to verify placement.   EBL Minimal   Specimen(s) None

## 2019-12-08 NOTE — Progress Notes (Addendum)
NAME:  Jeffrey Merritt, MRN:  [id number], DOB:  20-May-1981, LOS: 2 ADMISSION DATE:  12/06/2019, CONSULTATION DATE:  12/06/2019 REFERRING MD:  Dr. Blinda Leatherwood, CHIEF COMPLAINT:  Respiratory failure  Brief History   Unidentified male with suspected overdose with hypoxic respiratory failure and shock.   History of present illness   HPI obtained from ER staff as identity of patient is unknown.    Adult male whom EMS was called to [place] [facility] for suspected overdose, possibly cocaine.  No other information available.  Found unresponsive s/p multiple doses of narcan without improvement.  Was intubated by EMS and transported to ER.    On arrival, patient found to be esophageally intubated after noting ongoing hypoxia, significant abdominal distention, initiating breaths on his own, and then large amount of stomach contents coming up ETT.  He became bradycardic with saturations below 20.  He was reintubated by EDP.  He remained significantly hypoxic frothy bloody secretions with CXR consistent with pulmonary edema vs ARDS which have improved with suctioning and PEEP.  Empirically started on zosyn for possible aspiration.  Also persistently hypotensive despite 4L NS now s/p CVL placement and currently on levophed.  Labs noted for Hgb 11.9, glucose 420 with no AG and normal beta-hydroxybutyric acid, BUN 6, sCr 1.58, Lactic acid 6.2, neg acetaminophen/ salicylate level, UDS positive for amphetamines, cocaine, and THC.  PH 7.156/ 68/ 51.  CT head without acute intracranial abnormality.  PCCM called for admission.   Past Medical History  Unknown  Significant Hospital Events   7/8 Admitted  Consults:  Neurology  Procedures:  7/8 ETT >> 7/8 R IJ CVL >>  Significant Diagnostic Tests:  7/8 CTH/ cervical  >> 1. No acute intracranial abnormality. 2. Few sites of mild scalp infiltration over the left temporal and parietal scalp. No large hematoma or calvarial fracture. 3. No evidence of acute fracture or traumatic  listhesis of the cervical spine. 4. Multilevel degenerative changes of the cervical spine, as above.  5. Ground-glass and consolidative opacities in the lungs with some associated septal thickening could reflect interstitial edema though infection or ARDS would present with a similar appearance. 6. Remote appearing right lamina papyracea fracture. Additional remote appearing fracture of the right zygomatic arch and left mandible. Age indeterminate nasal bone deformities, favor remote as well though could correlate for point tenderness. 7. 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  Micro Data:  7/8 BCx 2 >> 7/8 SARS 2 >>negative 7/8 MRSA PCR >>positive  Antimicrobials:  7/8 zosyn >> 7/8 vanc >>  Interim history/subjective:  No events.  Remains on high doses of sedation.  Objective   Blood pressure 107/71, pulse 98, temperature 99.3 F (37.4 C), resp. rate (!) 24, height 5\' 9"  (1.753 m), weight 72.6 kg, SpO2 100 %. CVP:  [12 mmHg-13 mmHg] 12 mmHg  Vent Mode: PCV FiO2 (%):  [30 %-40 %] 40 % Set Rate:  [24 bmp] 24 bmp Vt Set:  [600 mL] 600 mL PEEP:  [8 cmH20] 8 cmH20 Plateau Pressure:  [22 cmH20-23 cmH20] 23 cmH20   Intake/Output Summary (Last 24 hours) at 12/08/2019 0819 Last data filed at 12/08/2019 0700 Gross per 24 hour  Intake 2296.09 ml  Output 3185 ml  Net -888.91 ml   Filed Weights   12/06/19 0530 12/07/19 0500 12/08/19 0341  Weight: 75.1 kg 75.6 kg 72.6 kg    Examination: GEN: no acute distress HEENT: ETT in place, no  secretions CV: RRR, ext warm PULM: Clear, no accessory muscle use GI: Soft, +BS EXT: No edema NEURO: too sedated to eval at this time PSYCH: too sedated to eval at this time SKIN: No rashes   Resolved Hospital Problem list   AKI  Shock  Assessment & Plan:   Acute encephalopathy Polysubstance use. Hx schizophrenia Acute respiratory failure due to  above Esophageal intubation prolonging hypoxemia Question aspiration pneumonia - Zosyn x 7 days - f/u BMP, lasix if Cr okay - Replete Phos - Probably need to just take off sedation +/- add precedex, extubate, and see where we stand today  Best practice:  Diet: hold for potential extubation Pain/Anxiety/Delirium protocol (if indicated): prn fentanyl, RASS goal 0/-1 VAP protocol (if indicated): yes DVT prophylaxis: SCDs, heparin GI prophylaxis: PPI Glucose control: SSI Mobility: BR Code Status: Full  Disposition: admit ICU    The patient is critically ill with multiple organ systems failure and requires high complexity decision making for assessment and support, frequent evaluation and titration of therapies, application of advanced monitoring technologies and extensive interpretation of multiple databases. Critical Care Time devoted to patient care services described in this note independent of APP/resident time (if applicable)  is 34 minutes.   Myrla Halsted MD [facility] 12/08/2019 8:23 AM Personal pager: [phone] If unanswered, please page CCM On-call: #[phone]

## 2019-12-08 NOTE — Progress Notes (Signed)
Patient extubated. Became more combative, could not redirect despite escalating precedex and PRN benzodiazepine doses. This coincided with worsening hypoxemia and he bit his tongue. Reintubated and will have to adjust PO regimen +/- get MRI, suspicion for anoxic brain injury given hx.  Myrla Halsted MD PCCM

## 2019-12-08 NOTE — Progress Notes (Signed)
NAME:  Jeffrey Merritt, MRN:  [id number], DOB:  1980-12-15, LOS: 2 ADMISSION DATE:  12/06/2019, CONSULTATION DATE:  12/06/2019 REFERRING MD:  Dr. Blinda LeatherwoodPollina, CHIEF COMPLAINT:  Respiratory failure  Brief History   suspected overdose with hypoxic respiratory failure and shock.   History of present illness   HPI obtained from ER staff as identity of patient is unknown.    Adult male whom EMS was called to local motel for suspected overdose, possibly cocaine.  No other information available.  Found unresponsive s/p multiple doses of narcan without improvement.  Was intubated by EMS and transported to ER.    On arrival, patient found to be esophageally intubated after noting ongoing hypoxia, significant abdominal distention, initiating breaths on his own, and then large amount of stomach contents coming up ETT.  He became bradycardic with saturations below 20.  He was reintubated by EDP.  He remained significantly hypoxic frothy bloody secretions with CXR consistent with pulmonary edema vs ARDS which have improved with suctioning and PEEP.  Empirically started on zosyn for possible aspiration.  Also persistently hypotensive despite 4L NS now s/p CVL placement and currently on levophed.  Labs noted for Hgb 11.9, glucose 420 with no AG and normal beta-hydroxybutyric acid, BUN 6, sCr 1.58, Lactic acid 6.2, neg acetaminophen/ salicylate level, UDS positive for amphetamines, cocaine, and THC.  PH 7.156/ 68/ 51.  CT head without acute intracranial abnormality.  PCCM called for admission.   Past Medical History  Unknown  Significant Hospital Events   7/8 Admitted  Consults:  Neurology  Procedures:  7/8 ETT >> 7/8 R IJ CVL >>  Significant Diagnostic Tests:  7/8 CTH/ cervical   1. No acute intracranial abnormality. 2. Few sites of mild scalp infiltration over the left temporal and parietal scalp. No large hematoma or calvarial fracture. 3. No evidence of acute fracture or traumatic listhesis of the cervical  spine. 4. Multilevel degenerative changes of the cervical spine, as above.  5. Ground-glass and consolidative opacities in the lungs with some associated septal thickening could reflect interstitial edema though infection or ARDS would present with a similar appearance. 6. Remote appearing right lamina papyracea fracture. Additional remote appearing fracture of the right zygomatic arch and left mandible. Age indeterminate nasal bone deformities, favor remote as well though could correlate for point tenderness. 7. 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 7/9 Echo 1. Left ventricular ejection fraction, by estimation, is 50 to 55%. The left ventricle has low normal function. Left ventricular endocardial border not optimally defined to evaluate regional wall motion. Left ventricular diastolic parameters are indeterminate. 2. Right ventricular systolic function is normal. The right ventricular size is normal. Tricuspid regurgitation signal is inadequate for assessing PA pressure. 3. The mitral valve is normal in structure. No evidence of mitral valve regurgitation. No evidence of mitral stenosis. 4. The aortic valve was not well visualized. Aortic valve regurgitation is not visualized. No aortic stenosis is present. 5. The inferior vena cava is dilated in size with <50% respiratory variability, suggesting right atrial pressure of 15 mmHg. 6. Technically difficult study with poor acoustic windows  Micro Data:  7/8 BCx 2 >> 7/8 SARS 2 >>negative 7/8 MRSA PCR >>positive  Antimicrobials:  7/8 zosyn >> 7/8 vanc >>  Interim history/subjective:  Overnight concern for SBO, KUB nl. Patient sedated on exam this morning.   Objective   Blood pressure 106/63, pulse 99, temperature 99.7 F (37.6 C),  resp. rate (!) 24, height 5\' 9"  (1.753 m), weight 72.6 kg, SpO2 100 %. CVP:  [12 mmHg-13 mmHg] 12 mmHg  Vent Mode:  PCV FiO2 (%):  [30 %-40 %] 40 % Set Rate:  [24 bmp-32 bmp] 24 bmp Vt Set:  [600 mL] 600 mL PEEP:  [8 cmH20-12 cmH20] 8 cmH20 Plateau Pressure:  [22 cmH20-27 cmH20] 23 cmH20   Intake/Output Summary (Last 24 hours) at 12/08/2019 0654 Last data filed at 12/08/2019 0600 Gross per 24 hour  Intake 2307.76 ml  Output 3010 ml  Net -702.24 ml   Filed Weights   12/06/19 0530 12/07/19 0500 12/08/19 0341  Weight: 75.1 kg 75.6 kg 72.6 kg    Examination: General:  Critically ill appearing male HEENT: pinpoint pupils, upward ETT Neuro: Sedated, pupils equal and reactive to light  CV: RRR, extremities warm. No LE edema PULM: mechanical breath sounds throughout.  GI: non-distended. Soft. Hypoactive bowel sounds  Extremities: warm Skin: no rash  Resolved Hospital Problem list   AKI  Shock  Assessment & Plan:   Acute encephalopathy Polysubstance use. Likely form drug overdose, have sent fentanyl /norfentanyl screen.   Head CT on admission negative for acute findings. Ammonia normal MRI brain normal.  He is able to follow some commands this morning  UDS positive for cocaine, THC, amphetamines; ETOH neg Plan: F/u [facility] send out urine for fentanyl/ norfentanyl Wean propofol , fentanyl , versed as able, transition to precedex Start precedex RAAS goal 0 to -1  Acute hypoxic respiratory failure due to ARDS requiring MV This is in the setting of suspected OD and Aspiration.Currently on PCV PEEP 12; FiO2 40%; Plateau pressure 2 Plan Continue MV on pressure control per ARDSnet protocol VAP bundle/ PPI  Wean vent as able   Leukocytosis  Shock (resolved) toxic vs septic.  Off levo overnight, MAP ~70  Echo with EF 50-55%, not able to access for wall motion abnormalities Continued to fever overnight with WBC trending down 12.9. Blood cultures NGTD. Vanc stopped 7/9 Plan:  MAP goal >65 Follow blood cultures Continue Zosyn, Day 4  NAGMA  Hypokalemia, hypophosphatemia , hypomagnesia    Plan:  Replete lytes  Strict I/O, daily BMP   Normocytic anemia. hgb downtrending since admission from 14>12>11>9 . No obvious signs of active bleeding at this time however will continue to monitor.  Hyperglycemia  - CBG monitoring with SSI.  High risk malnutrition.  - Continue tube fees  Best practice:  Diet: TF Pain/Anxiety/Delirium protocol (if indicated): prn fentanyl, RASS goal 0/-1 VAP protocol (if indicated): yes DVT prophylaxis: SCDs, heparin GI prophylaxis: PPI Glucose control: SSI Mobility: BR Code Status: Full  Family Communication:  Disposition: admit ICU  Labs   CBC: Recent Labs  Lab 12/06/19 0201 12/06/19 0236 12/06/19 1003 12/06/19 1222 12/07/19 0520 12/07/19 0916 12/08/19 0342  WBC 9.4  --   --   --  17.5*  --  12.9*  NEUTROABS 5.7  --   --   --   --   --   --   HGB 11.9*   < > 12.9* 12.2* 11.2* 10.2* 9.2*  HCT 38.8*   < > 38.0* 36.0* 33.9* 30.0* 28.1*  MCV 90.9  --   --   --  87.4  --  87.3  PLT 359  --   --   --  248  --  220   < > = values in this interval not displayed.    Basic Metabolic Panel: Recent Labs  Lab  12/06/19 0152 12/06/19 0152 12/06/19 0201 12/06/19 0236 12/06/19 7096 12/06/19 0654 12/06/19 1003 12/06/19 1139 12/06/19 1222 12/06/19 1718 12/07/19 0520 12/07/19 0916 12/07/19 1647 12/08/19 0342  NA 136  136   < > 134*   < > 138   < > 142 138 142  --  137 140  --   --   K 4.2  4.2   < > 4.1   < > 6.6*   < > 5.4* 5.0 4.8  --  3.4* 3.5  --   --   CL 101  101  --  102  --  113*  --   --  111  --   --  108  --   --   --   CO2  --   --  22  --  22  --   --  22  --   --  19*  --   --   --   GLUCOSE 367*  367*  --  420*  --  108*  --   --  131*  --   --  127*  --   --   --   BUN 6  6*  --  6  --  8  --   --  10  --   --  11  --   --   --   CREATININE 1.40*  1.40*  --  1.58*  --  1.20  --   --  1.07  --   --  1.09  --   --   --   CALCIUM  --   --  8.3*  --  7.4*  --   --  7.4*  --   --  8.0*  --   --   --   MG  --   --    --   --   --   --   --   --   --  1.7 1.7  --  1.9 1.7  PHOS  --   --   --   --   --   --   --   --   --  3.1 2.0*  --  2.4* 1.1*   < > = values in this interval not displayed.   GFR: Estimated Creatinine Clearance: 91 mL/min (by C-G formula based on SCr of 1.09 mg/dL). Recent Labs  Lab 12/06/19 0201 12/06/19 0237 12/06/19 0424 12/06/19 0652 12/07/19 0520 12/08/19 0342  PROCALCITON  --   --   --  5.80  --   --   WBC 9.4  --   --   --  17.5* 12.9*  LATICACIDVEN  --  6.2* 1.2 0.9  --   --     Liver Function Tests: Recent Labs  Lab 12/06/19 0201 12/06/19 0652  AST 32 39  ALT 23 31  ALKPHOS 66 61  BILITOT 0.5 0.6  PROT 5.2* 5.6*  ALBUMIN 2.5* 2.6*   No results for input(s): LIPASE, AMYLASE in the last 168 hours. Recent Labs  Lab 12/06/19 0337  AMMONIA 18    ABG    Component Value Date/Time   PHART 7.377 12/07/2019 0916   PCO2ART 37.9 12/07/2019 0916   PO2ART 132 (H) 12/07/2019 0916   HCO3 22.5 12/07/2019 0916   TCO2 24 12/07/2019 0916   ACIDBASEDEF 3.0 (H) 12/07/2019 0916   O2SAT 99.0 12/07/2019 0916     Milus Banister, MD Internal Medicine Resident PGY 2 12/08/19  6:54 AM

## 2019-12-08 NOTE — Progress Notes (Signed)
Post intubation ABG obtained on ventilator settings of PCV: 18, PEEP: 10, RR: 24. FIO2: 100%.  Decreased FIO2 to 50%.  No further changes at this time.  Will continue to monitor.    Ref. Range 12/08/2019 13:09  Sample type Unknown ARTERIAL  pH, Arterial Latest Ref Range: 7.35 - 7.45  7.426  pCO2 arterial Latest Ref Range: 32 - 48 mmHg 38.2  pO2, Arterial Latest Ref Range: 83 - 108 mmHg 345 (H)  TCO2 Latest Ref Range: 22 - 32 mmol/L 26  Acid-Base Excess Latest Ref Range: 0.0 - 2.0 mmol/L 1.0  Bicarbonate Latest Ref Range: 20.0 - 28.0 mmol/L 25.0  O2 Saturation Latest Units: % 100.0  Patient temperature Unknown 37.5 C  Collection site Unknown Radial

## 2019-12-09 ENCOUNTER — Inpatient Hospital Stay (HOSPITAL_COMMUNITY): Payer: Medicare Other

## 2019-12-09 LAB — CBC
HCT: 27.5 % — ABNORMAL LOW (ref 39.0–52.0)
Hemoglobin: 8.8 g/dL — ABNORMAL LOW (ref 13.0–17.0)
MCH: 28.2 pg (ref 26.0–34.0)
MCHC: 32 g/dL (ref 30.0–36.0)
MCV: 88.1 fL (ref 80.0–100.0)
Platelets: 256 10*3/uL (ref 150–400)
RBC: 3.12 MIL/uL — ABNORMAL LOW (ref 4.22–5.81)
RDW: 13.5 % (ref 11.5–15.5)
WBC: 12.6 10*3/uL — ABNORMAL HIGH (ref 4.0–10.5)
nRBC: 0 % (ref 0.0–0.2)

## 2019-12-09 LAB — GLUCOSE, CAPILLARY
Glucose-Capillary: 105 mg/dL — ABNORMAL HIGH (ref 70–99)
Glucose-Capillary: 109 mg/dL — ABNORMAL HIGH (ref 70–99)
Glucose-Capillary: 115 mg/dL — ABNORMAL HIGH (ref 70–99)
Glucose-Capillary: 85 mg/dL (ref 70–99)
Glucose-Capillary: 89 mg/dL (ref 70–99)
Glucose-Capillary: 98 mg/dL (ref 70–99)

## 2019-12-09 LAB — TRIGLYCERIDES: Triglycerides: 136 mg/dL (ref <150)

## 2019-12-09 LAB — BASIC METABOLIC PANEL
Anion gap: 6 (ref 5–15)
BUN: 5 mg/dL — ABNORMAL LOW (ref 6–20)
CO2: 24 mmol/L (ref 22–32)
Calcium: 8.1 mg/dL — ABNORMAL LOW (ref 8.9–10.3)
Chloride: 107 mmol/L (ref 98–111)
Creatinine, Ser: 1 mg/dL (ref 0.61–1.24)
GFR calc Af Amer: >60 mL/min (ref >60)
GFR calc non Af Amer: >60 mL/min (ref >60)
Glucose, Bld: 131 mg/dL — ABNORMAL HIGH (ref 70–99)
Potassium: 3.5 mmol/L (ref 3.5–5.1)
Sodium: 137 mmol/L (ref 135–145)

## 2019-12-09 MED ORDER — CITALOPRAM HYDROBROMIDE 20 MG PO TABS
20.0000 mg | ORAL_TABLET | Freq: Every day | ORAL | Status: DC
  Administered 2019-12-09 – 2019-12-13 (×5): 20 mg via ORAL
  Filled 2019-12-09 (×5): qty 1

## 2019-12-09 MED ORDER — POTASSIUM CHLORIDE 20 MEQ/15ML (10%) PO SOLN
40.0000 meq | ORAL | Status: AC
  Administered 2019-12-09 (×2): 40 meq via ORAL
  Filled 2019-12-09 (×2): qty 30

## 2019-12-09 MED ORDER — FUROSEMIDE 10 MG/ML IJ SOLN
40.0000 mg | Freq: Three times a day (TID) | INTRAMUSCULAR | Status: DC
  Administered 2019-12-09 – 2019-12-11 (×7): 40 mg via INTRAVENOUS
  Filled 2019-12-09 (×7): qty 4

## 2019-12-09 MED ORDER — QUETIAPINE FUMARATE 50 MG PO TABS
50.0000 mg | ORAL_TABLET | Freq: Two times a day (BID) | ORAL | Status: DC
  Administered 2019-12-09 – 2019-12-13 (×9): 50 mg via ORAL
  Filled 2019-12-09 (×9): qty 1

## 2019-12-09 MED ORDER — GADOBUTROL 1 MMOL/ML IV SOLN
7.5000 mL | Freq: Once | INTRAVENOUS | Status: AC | PRN
  Administered 2019-12-09: 7.5 mL via INTRAVENOUS

## 2019-12-09 MED ORDER — CLONAZEPAM 1 MG PO TABS
1.0000 mg | ORAL_TABLET | Freq: Two times a day (BID) | ORAL | Status: DC
  Administered 2019-12-09 – 2019-12-13 (×9): 1 mg via ORAL
  Filled 2019-12-09 (×9): qty 1

## 2019-12-09 NOTE — Progress Notes (Signed)
RT transported patient from 2M08 to MRI and back with RN. No complications. RT will continue to monitor.

## 2019-12-09 NOTE — Progress Notes (Signed)
NAME:  Jeffrey Merritt, MRN:  [id number], DOB:  08-19-1980, LOS: 3 ADMISSION DATE:  12/06/2019, CONSULTATION DATE:  12/06/2019 REFERRING MD:  Dr. Blinda Leatherwood, CHIEF COMPLAINT:  Respiratory failure  Brief History   Unidentified male with suspected overdose with hypoxic respiratory failure and shock.   History of present illness   HPI obtained from ER staff as identity of patient is unknown.    Adult male whom EMS was [place] to local [facility] for suspected overdose, possibly cocaine.  No other information available.  Found unresponsive s/p multiple doses of narcan without improvement.  Was intubated by EMS and transported to ER.    On arrival, patient found to be esophageally intubated after noting ongoing hypoxia, significant abdominal distention, initiating breaths on his own, and then large amount of stomach contents coming up ETT.  He became bradycardic with saturations below 20.  He was reintubated by EDP.  He remained significantly hypoxic frothy bloody secretions with CXR consistent with pulmonary edema vs ARDS which have improved with suctioning and PEEP.  Empirically started on zosyn for possible aspiration.  Also persistently hypotensive despite 4L NS now s/p CVL placement and currently on levophed.  Labs noted for Hgb 11.9, glucose 420 with no AG and normal beta-hydroxybutyric acid, BUN 6, sCr 1.58, Lactic acid 6.2, neg acetaminophen/ salicylate level, UDS positive for amphetamines, cocaine, and THC.  PH 7.156/ 68/ 51.  CT head without acute intracranial abnormality.  PCCM called for admission.   Past Medical History  Unknown  Significant Hospital Events   7/8 Admitted  Consults:  Neurology  Procedures:  7/8 ETT >>7/10, reintubated 7/10 7/8 R IJ CVL >>  Significant Diagnostic Tests:  7/8 CTH/ cervical  >> 1. No acute intracranial abnormality. 2. Few sites of mild scalp infiltration over the left temporal and parietal scalp. No large hematoma or calvarial fracture. 3. No evidence of acute  fracture or traumatic listhesis of the cervical spine. 4. Multilevel degenerative changes of the cervical spine, as above.  5. Ground-glass and consolidative opacities in the lungs with some associated septal thickening could reflect interstitial edema though infection or ARDS would present with a similar appearance. 6. Remote appearing right lamina papyracea fracture. Additional remote appearing fracture of the right zygomatic arch and left mandible. Age indeterminate nasal bone deformities, favor remote as well though could correlate for point tenderness. 7. 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  Micro Data:  7/8 BCx 2 >> 7/8 SARS 2 >>negative 7/8 MRSA PCR >>positive  Antimicrobials:  7/8 zosyn >> 7/8 vanc >>  Interim history/subjective:  Failed extubation trial due to agitation and inability to handle secretions. Febrile overnight with increasing ETT secretions.  Objective   Blood pressure 126/68, pulse (!) 125, temperature (!) 102.4 F (39.1 C), resp. rate (!) 23, height 5\' 9"  (1.753 m), weight 72.6 kg, SpO2 99 %.    Vent Mode: PCV FiO2 (%):  [40 %-100 %] 40 % Set Rate:  [24 bmp] 24 bmp PEEP:  [8 cmH20-10 cmH20] 10 cmH20 Plateau Pressure:  [24 cmH20-25 cmH20] 25 cmH20   Intake/Output Summary (Last 24 hours) at 12/09/2019 0743 Last data filed at 12/09/2019 0300 Gross per 24 hour  Intake 1779.47 ml  Output 2175 ml  Net -395.53 ml   Filed Weights   12/06/19 0530 12/07/19 0500 12/08/19 0341  Weight: 75.1 kg 75.6 kg 72.6 kg    Examination: GEN: no acute distress HEENT: ETT in place,  thick tan/bloody secretions CV: RRR, ext warm PULM: Scattered rhonci, no accessory muscle use GI: Soft, +BS EXT: No edema NEURO: too sedated to eval at this time, yesterday he would curse and flail all 4 ext after extubation PSYCH: too sedated to eval at this time SKIN: No rashes   Resolved Hospital  Problem list   AKI  Shock  Assessment & Plan:   Acute encephalopathy, Hx schizophrenia, Polysubstance use.  Teasing out withdrawal vs. Baseline vs. Anoxic brain damage is near impossible at this point.  Anoxic injury possible in setting of esophageal intubation in field. - MRI brain to help sort out if this can all be explained by anoxic brain injury - If MRI brain neg, need to work on oral CNS meds that will not depress respiratory drive too much. For now going to start seroquel, citalopram, and clonazepam + will try to work on weaning IV drips   Acute respiratory failure due to above Esophageal intubation prolonging hypoxemia Likely aspiration pneumonia - Zosyn x 7 days - Push diuresis - Check tracheal aspirate given worsened fevers but I think this is just due to aspiration that occurred with extubation trial on 7/10  Best practice:  Diet: TF Pain/Anxiety/Delirium protocol (if indicated): see above VAP protocol (if indicated): yes DVT prophylaxis: SCDs, heparin GI prophylaxis: PPI Glucose control: SSI Mobility: BR Code Status: Full  Disposition: admit ICU    The patient is critically ill with multiple organ systems failure and requires high complexity decision making for assessment and support, frequent evaluation and titration of therapies, application of advanced monitoring technologies and extensive interpretation of multiple databases. Critical Care Time devoted to patient care services described in this note independent of APP/resident time (if applicable)  is 35 minutes.   Myrla Halsted MD [facility] 12/09/2019 7:43 AM Personal pager: [phone] If unanswered, please page CCM On-call: #[phone]

## 2019-12-10 ENCOUNTER — Inpatient Hospital Stay (HOSPITAL_COMMUNITY): Payer: Medicare Other

## 2019-12-10 LAB — GLUCOSE, CAPILLARY
Glucose-Capillary: 105 mg/dL — ABNORMAL HIGH (ref 70–99)
Glucose-Capillary: 107 mg/dL — ABNORMAL HIGH (ref 70–99)
Glucose-Capillary: 108 mg/dL — ABNORMAL HIGH (ref 70–99)
Glucose-Capillary: 104 mg/dL — ABNORMAL HIGH (ref 70–99)
Glucose-Capillary: 105 mg/dL — ABNORMAL HIGH (ref 70–99)
Glucose-Capillary: 88 mg/dL (ref 70–99)

## 2019-12-10 LAB — MAGNESIUM: Magnesium: 1.8 mg/dL (ref 1.7–2.4)

## 2019-12-10 LAB — BASIC METABOLIC PANEL
Anion gap: 11 (ref 5–15)
BUN: 9 mg/dL (ref 6–20)
CO2: 27 mmol/L (ref 22–32)
Calcium: 8.8 mg/dL — ABNORMAL LOW (ref 8.9–10.3)
Chloride: 100 mmol/L (ref 98–111)
Creatinine, Ser: 1.03 mg/dL (ref 0.61–1.24)
GFR calc Af Amer: >60 mL/min (ref >60)
GFR calc non Af Amer: >60 mL/min (ref >60)
Glucose, Bld: 97 mg/dL (ref 70–99)
Potassium: 3.5 mmol/L (ref 3.5–5.1)
Sodium: 138 mmol/L (ref 135–145)

## 2019-12-10 LAB — CBC
HCT: 23.3 % — ABNORMAL LOW (ref 39.0–52.0)
Hemoglobin: 7.8 g/dL — ABNORMAL LOW (ref 13.0–17.0)
MCH: 28.7 pg (ref 26.0–34.0)
MCHC: 33.5 g/dL (ref 30.0–36.0)
MCV: 85.7 fL (ref 80.0–100.0)
Platelets: 353 10*3/uL (ref 150–400)
RBC: 2.72 MIL/uL — ABNORMAL LOW (ref 4.22–5.81)
RDW: 13.2 % (ref 11.5–15.5)
WBC: 11.3 10*3/uL — ABNORMAL HIGH (ref 4.0–10.5)
nRBC: 0 % (ref 0.0–0.2)

## 2019-12-10 LAB — PHOSPHORUS: Phosphorus: 2.5 mg/dL (ref 2.5–4.6)

## 2019-12-10 LAB — TRIGLYCERIDES: Triglycerides: 183 mg/dL — ABNORMAL HIGH (ref <150)

## 2019-12-10 MED ORDER — MAGNESIUM SULFATE 2 GM/50ML IV SOLN
2.0000 g | Freq: Once | INTRAVENOUS | Status: AC
  Administered 2019-12-10: 2 g via INTRAVENOUS
  Filled 2019-12-10: qty 50

## 2019-12-10 MED ORDER — MIDAZOLAM BOLUS VIA INFUSION
1.0000 mg | Freq: Once | INTRAVENOUS | Status: AC
  Administered 2019-12-10: 1 mg via INTRAVENOUS
  Filled 2019-12-10: qty 1

## 2019-12-10 MED ORDER — FENTANYL BOLUS VIA INFUSION
50.0000 ug | Freq: Once | INTRAVENOUS | Status: AC
  Administered 2019-12-10: 50 ug via INTRAVENOUS
  Filled 2019-12-10: qty 50

## 2019-12-10 NOTE — Progress Notes (Signed)
Pt aggitated with ETT movement. RT attempted to place pt on SB CPAP/PS. Pt became apneic with no effort despite stimulation. RT returned pt to full support. RT will attempt at a later time. Will continue to monitor.

## 2019-12-10 NOTE — Progress Notes (Addendum)
NAME:  Jeffrey Merritt, MRN:  [id number], DOB:  August 18, 1980, LOS: 4 ADMISSION DATE:  12/06/2019, CONSULTATION DATE:  12/06/2019 REFERRING MD:  Dr. Blinda Leatherwood, CHIEF COMPLAINT:  Respiratory failure  Brief History   Suspected overdose with hypoxic respiratory failure and shock.   History of present illness   HPI obtained from ER staff as identity of patient is unknown.    Adult male whom EMS was called to local [facility] for suspected overdose, possibly cocaine.  No other information available.  Found unresponsive s/p multiple doses of narcan without improvement.  Was intubated by EMS and transported to ER.    On arrival, patient found to be esophageally intubated after noting ongoing hypoxia, significant abdominal distention, initiating breaths on his own, and then large amount of stomach contents coming up ETT.  He became bradycardic with saturations below 20.  He was reintubated by EDP.  He remained significantly hypoxic frothy bloody secretions with CXR consistent with pulmonary edema vs ARDS which have improved with suctioning and PEEP.  Empirically started on zosyn for possible aspiration.  Also persistently hypotensive despite 4L NS now s/p CVL placement and currently on levophed.  Labs noted for Hgb 11.9, glucose 420 with no AG and normal beta-hydroxybutyric acid, BUN 6, sCr 1.58, Lactic acid 6.2, neg acetaminophen/ salicylate level, UDS positive for amphetamines, cocaine, and THC.  PH 7.156/ 68/ 51.  CT head without acute intracranial abnormality.  PCCM called for admission.   Past Medical History  Unknown  Significant Hospital Events   7/8 Admitted  Consults:  Neurology  Procedures:  7/8 ETT >>7/10, reintubated 7/10 7/8 R IJ CVL >>  Significant Diagnostic Tests:  7/8 CTH/ cervical  >> 1. No acute intracranial abnormality. 2. Few sites of mild scalp infiltration over the left temporal and parietal scalp. No large hematoma or calvarial fracture. 3. No evidence of acute fracture or traumatic  listhesis of the cervical spine. 4. Multilevel degenerative changes of the cervical spine, as above.  5. Ground-glass and consolidative opacities in the lungs with some associated septal thickening could reflect interstitial edema though infection or ARDS would present with a similar appearance. 6. Remote appearing right lamina papyracea fracture. Additional remote appearing fracture of the right zygomatic arch and left mandible. Age indeterminate nasal bone deformities, favor remote as well though could correlate for point tenderness. 7. 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  Micro Data:  7/8 BCx 2 >> 7/8 SARS 2 >>negative 7/8 MRSA PCR >>positive  Antimicrobials:  7/8 zosyn >> 7/8 vanc >>  Interim history/subjective:  Failed extubation trial due to agitation and inability to handle secretions. Febrile overnight with increasing ETT secretions.  Objective   Blood pressure 121/83, pulse 91, temperature 97.7 F (36.5 C), temperature source Axillary, resp. rate (!) 24, height 5\' 9"  (1.753 m), weight 72.6 kg, SpO2 100 %.    Vent Mode: PCV FiO2 (%):  [40 %] 40 % Set Rate:  [24 bmp] 24 bmp PEEP:  [8 cmH20] 8 cmH20 Plateau Pressure:  [17 cmH20-21 cmH20] 21 cmH20   Intake/Output Summary (Last 24 hours) at 12/10/2019 0856 Last data filed at 12/10/2019 0821 Gross per 24 hour  Intake 2520.66 ml  Output 7375 ml  Net -4854.34 ml   Filed Weights   12/06/19 0530 12/07/19 0500 12/08/19 0341  Weight: 75.1 kg 75.6 kg 72.6 kg    Examination: GEN: appears uncomfortable HEENT: ETT tube in place, antiicteric sclera CV: RRR,  no mrg PULM: BL vent breath sounds  GI: bowel sounds present  EXT: no edema, no deformities NEURO: opens eyes and following commands SKIN: warm dry   Noted labs: WBC down trending, Mg 1.8 , Phos 2.5 , CBG at goal  Chest xray 7/12 - improved pulmonary edema and RUL opacity  compared to xray on 10th Resolved Hospital Problem list   AKI  Shock  Assessment & Plan:   Acute encephalopathy vs baseline with Hx schizophrenia, Polysubstance use. For now continue seroquel, citalopram, and clonazepam  - wean IV drips - may need additional oral psychiatric medications    Acute respiratory failure due to above Esophageal intubation prolonging hypoxemia Likely aspiration pneumonia Tracheal aspirate recultured on 7/11 due to fevers , no organisms. Fevered once again overnight. Leukocytosis trending down. Pulmonary edema and RUL opacity improved - Zosyn x 7 days, stop date 7/13 - trend fever  Best practice:  Diet: TF Pain/Anxiety/Delirium protocol (if indicated): see above VAP protocol (if indicated): yes DVT prophylaxis: SCDs, heparin GI prophylaxis: PPI Glucose control: SSI Mobility: BR Code Status: Full  Disposition: admit ICU   Thurmon Fair, MD PGY2   PCCM:  39 yo M, polysubstance abuse, concern for OD, schizophrenia at baseline, intubated on MV.   BP 111/76   Pulse 93   Temp 97.9 F (36.6 C) (Axillary)   Resp (!) 24   Ht 5\' 9"  (1.753 m)   Wt 72.6 kg   SpO2 100%   BMI 23.64 kg/m   Gen: young male, intubated on life support  HENT: ETT in place  Heart: regular, tachy, s1 s2 Lungs: BL mech ventilated breaths  Abd: soft, nt nd   Labs: reviewed   A:  OD, poly substance abuse  On mulitple ggt medications, versed, fent, propofol, still awake  Aspiration pneumonia, RUL infiltrate   P: Weaning from vent  Sedation is going to be tricky.  I think we will have to lighten and consider precedex Possible SBT SAT on precedex and consider extubating on it  He become very agitated   This patient is critically ill with multiple organ system failure; which, requires frequent high complexity decision making, assessment, support, evaluation, and titration of therapies. This was completed through the application of advanced monitoring technologies and  extensive interpretation of multiple databases. During this encounter critical care time was devoted to patient care services described in this note for 32 minutes.  , DO [facility] 12/10/2019 5:21 PM

## 2019-12-10 NOTE — Progress Notes (Signed)
Pharmacy Electrolyte Replacement  Recent Labs:  Recent Labs    12/10/19 0500  K 3.5  MG 1.8  PHOS 2.5  CREATININE 1.03    Low Critical Values (K </= 2.5, Phos </= 1, Mg </= 1) Present: None  MD Contacted: Steen  Plan:  - Mg 2g IV x 1, recheck with AM labs on 7/13   Thank you for allowing pharmacy to be a part of this patient's care.  Georgina Pillion, PharmD, BCPS Clinical Pharmacist Clinical phone for 12/10/2019: [phone] 12/10/2019 11:21 AM   **Pharmacist phone directory can now be found on [email or web address] ([email or web address]).  Listed under [facility].

## 2019-12-11 LAB — BASIC METABOLIC PANEL
Anion gap: 12 (ref 5–15)
Anion gap: 13 (ref 5–15)
BUN: 12 mg/dL (ref 6–20)
BUN: 14 mg/dL (ref 6–20)
CO2: 27 mmol/L (ref 22–32)
CO2: 28 mmol/L (ref 22–32)
Calcium: 9.2 mg/dL (ref 8.9–10.3)
Calcium: 9.2 mg/dL (ref 8.9–10.3)
Chloride: 100 mmol/L (ref 98–111)
Chloride: 98 mmol/L (ref 98–111)
Creatinine, Ser: 1.13 mg/dL (ref 0.61–1.24)
Creatinine, Ser: 1.26 mg/dL — ABNORMAL HIGH (ref 0.61–1.24)
GFR calc Af Amer: >60 mL/min (ref >60)
GFR calc Af Amer: >60 mL/min (ref >60)
GFR calc non Af Amer: >60 mL/min (ref >60)
GFR calc non Af Amer: >60 mL/min (ref >60)
Glucose, Bld: 101 mg/dL — ABNORMAL HIGH (ref 70–99)
Glucose, Bld: 137 mg/dL — ABNORMAL HIGH (ref 70–99)
Potassium: 3.6 mmol/L (ref 3.5–5.1)
Potassium: 3.5 mmol/L (ref 3.5–5.1)
Sodium: 139 mmol/L (ref 135–145)
Sodium: 139 mmol/L (ref 135–145)

## 2019-12-11 LAB — CULTURE, BLOOD (ROUTINE X 2)
Culture: NO GROWTH
Culture: NO GROWTH
Special Requests: ADEQUATE
Special Requests: ADEQUATE

## 2019-12-11 LAB — CBC
HCT: 32.9 % — ABNORMAL LOW (ref 39.0–52.0)
HCT: 35 % — ABNORMAL LOW (ref 39.0–52.0)
Hemoglobin: 10.8 g/dL — ABNORMAL LOW (ref 13.0–17.0)
Hemoglobin: 11.1 g/dL — ABNORMAL LOW (ref 13.0–17.0)
MCH: 27.9 pg (ref 26.0–34.0)
MCH: 27.5 pg (ref 26.0–34.0)
MCHC: 32.8 g/dL (ref 30.0–36.0)
MCHC: 31.7 g/dL (ref 30.0–36.0)
MCV: 85 fL (ref 80.0–100.0)
MCV: 86.6 fL (ref 80.0–100.0)
Platelets: 376 10*3/uL (ref 150–400)
Platelets: 406 10*3/uL — ABNORMAL HIGH (ref 150–400)
RBC: 3.87 MIL/uL — ABNORMAL LOW (ref 4.22–5.81)
RBC: 4.04 MIL/uL — ABNORMAL LOW (ref 4.22–5.81)
RDW: 13 % (ref 11.5–15.5)
RDW: 13.1 % (ref 11.5–15.5)
WBC: 10.8 10*3/uL — ABNORMAL HIGH (ref 4.0–10.5)
WBC: 11.2 10*3/uL — ABNORMAL HIGH (ref 4.0–10.5)
nRBC: 0 % (ref 0.0–0.2)
nRBC: 0 % (ref 0.0–0.2)

## 2019-12-11 LAB — GLUCOSE, CAPILLARY
Glucose-Capillary: 105 mg/dL — ABNORMAL HIGH (ref 70–99)
Glucose-Capillary: 136 mg/dL — ABNORMAL HIGH (ref 70–99)
Glucose-Capillary: 78 mg/dL (ref 70–99)
Glucose-Capillary: 91 mg/dL (ref 70–99)
Glucose-Capillary: 94 mg/dL (ref 70–99)
Glucose-Capillary: 98 mg/dL (ref 70–99)

## 2019-12-11 LAB — CULTURE, RESPIRATORY W GRAM STAIN: Culture: NORMAL

## 2019-12-11 LAB — MAGNESIUM
Magnesium: 2 mg/dL (ref 1.7–2.4)
Magnesium: 2.2 mg/dL (ref 1.7–2.4)

## 2019-12-11 LAB — TRIGLYCERIDES: Triglycerides: 249 mg/dL — ABNORMAL HIGH (ref <150)

## 2019-12-11 LAB — PHOSPHORUS: Phosphorus: 3.5 mg/dL (ref 2.5–4.6)

## 2019-12-11 LAB — LACTIC ACID, PLASMA: Lactic Acid, Venous: 0.7 mmol/L (ref 0.5–1.9)

## 2019-12-11 MED ORDER — OXYCODONE HCL 5 MG PO TABS
5.0000 mg | ORAL_TABLET | Freq: Four times a day (QID) | ORAL | Status: DC | PRN
  Administered 2019-12-11: 5 mg via ORAL
  Administered 2019-12-12 (×2): 10 mg via ORAL
  Filled 2019-12-11: qty 2
  Filled 2019-12-11: qty 1
  Filled 2019-12-11: qty 2

## 2019-12-11 MED ORDER — DEXMEDETOMIDINE HCL IN NACL 400 MCG/100ML IV SOLN
0.4000 ug/kg/h | INTRAVENOUS | Status: DC
  Administered 2019-12-11: 1 ug/kg/h via INTRAVENOUS
  Filled 2019-12-11: qty 100

## 2019-12-11 MED ORDER — SODIUM CHLORIDE 0.9 % IV BOLUS
1000.0000 mL | Freq: Once | INTRAVENOUS | Status: AC
  Administered 2019-12-11: 1000 mL via INTRAVENOUS

## 2019-12-11 MED ORDER — NOREPINEPHRINE 4 MG/250ML-% IV SOLN
INTRAVENOUS | Status: AC
  Administered 2019-12-11: 40 ug/min via INTRAVENOUS
  Filled 2019-12-11: qty 250

## 2019-12-11 MED ORDER — NOREPINEPHRINE 4 MG/250ML-% IV SOLN: 0.0000 ug/min | INTRAVENOUS | Status: DC

## 2019-12-11 MED ORDER — ACETAMINOPHEN 160 MG/5ML PO SOLN
650.0000 mg | Freq: Four times a day (QID) | ORAL | Status: DC | PRN
  Administered 2019-12-11: 650 mg via ORAL
  Filled 2019-12-11: qty 20.3

## 2019-12-11 MED ORDER — OXYCODONE HCL 5 MG PO TABS
10.0000 mg | ORAL_TABLET | Freq: Four times a day (QID) | ORAL | Status: DC
  Administered 2019-12-11 (×2): 10 mg
  Filled 2019-12-11 (×2): qty 2

## 2019-12-11 MED ORDER — POTASSIUM CHLORIDE 10 MEQ/50ML IV SOLN
10.0000 meq | INTRAVENOUS | Status: AC
  Administered 2019-12-11 (×4): 10 meq via INTRAVENOUS
  Filled 2019-12-11 (×4): qty 50

## 2019-12-11 NOTE — Procedures (Signed)
Extubation Procedure Note  Patient Details:   Name: Jeffrey Merritt DOB: 1980/10/16 MRN: [id number]   Airway Documentation:    Vent end date: 12/11/19 Vent end time: 1105   Evaluation  O2 sats: stable throughout Complications: No apparent complications Patient did tolerate procedure well. Bilateral Breath Sounds: Clear, Diminished   Yes   Pt extubated per physician order. Pt suctioned via ETT and orally prior with a positive cuff leak. Pt extubated to 4L nasal cannula with humidity. Pt able to speak name, give a strong cough and no stridor was heard at this time. RT will continue to monitor.   [name] 12/11/2019, 11:09 AM

## 2019-12-11 NOTE — Progress Notes (Addendum)
NAME:  Jeffrey Merritt, MRN:  [id number], DOB:  Dec 13, 1980, LOS: 5 ADMISSION DATE:  12/06/2019, CONSULTATION DATE:  12/06/2019 REFERRING MD:  Dr. Blinda Leatherwood, CHIEF COMPLAINT:  Respiratory failure  Brief History   Suspected overdose with hypoxic respiratory failure and shock.   History of present illness   HPI obtained from ER staff as identity of patient is unknown.    Adult male whom EMS was called to local motel for suspected overdose, possibly cocaine.  No other information available.  Found unresponsive s/p multiple doses of narcan without improvement.  Was intubated by EMS and transported to ER.    On arrival, patient found to be esophageally intubated after noting ongoing hypoxia, significant abdominal distention, initiating breaths on his own, and then large amount of stomach contents coming up ETT.  He became bradycardic with saturations below 20.  He was reintubated by EDP.  He remained significantly hypoxic frothy bloody secretions with CXR consistent with pulmonary edema vs ARDS which have improved with suctioning and PEEP.  Empirically started on zosyn for possible aspiration.  Also persistently hypotensive despite 4L NS now s/p CVL placement and currently on levophed.  Labs noted for Hgb 11.9, glucose 420 with no AG and normal beta-hydroxybutyric acid, BUN 6, sCr 1.58, Lactic acid 6.2, neg acetaminophen/ salicylate level, UDS positive for amphetamines, cocaine, and THC.  PH 7.156/ 68/ 51.  CT head without acute intracranial abnormality.  PCCM called for admission.   Past Medical History  Unknown  Significant Hospital Events   7/8 Admitted  Consults:  Neurology  Procedures:  7/8 ETT >>7/10, reintubated 7/10 7/8 R IJ CVL >>  Significant Diagnostic Tests:  7/8 CTH/ cervical  >> 1. No acute intracranial abnormality. 2. Few sites of mild scalp infiltration over the left temporal and parietal scalp. No large hematoma or calvarial fracture. 3. No evidence of acute fracture or traumatic  listhesis of the cervical spine. 4. Multilevel degenerative changes of the cervical spine, as above.  5. Ground-glass and consolidative opacities in the lungs with some associated septal thickening could reflect interstitial edema though infection or ARDS would present with a similar appearance. 6. Remote appearing right lamina papyracea fracture. Additional remote appearing fracture of the right zygomatic arch and left mandible. Age indeterminate nasal bone deformities, favor remote as well though could correlate for point tenderness. 7. 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  Micro Data:  7/8 BCx 2 >> 7/8 SARS 2 >>negative 7/8 MRSA PCR >>positive  Antimicrobials:  7/8 zosyn >> 7/8 vanc >>  Interim history/subjective:  Afebrile overnight. No acute events.   Objective   Blood pressure 106/72, pulse 87, temperature 98.4 F (36.9 C), resp. rate (!) 24, height 5\' 9"  (1.753 m), weight 68.3 kg, SpO2 100 %.    Vent Mode: PCV FiO2 (%):  [40 %] 40 % Set Rate:  [24 bmp] 24 bmp PEEP:  [5 cmH20] 5 cmH20 Plateau Pressure:  [17 cmH20] 17 cmH20   Intake/Output Summary (Last 24 hours) at 12/11/2019 0757 Last data filed at 12/11/2019 0745 Gross per 24 hour  Intake 2913.13 ml  Output 6515 ml  Net -3601.87 ml   Filed Weights   12/07/19 0500 12/08/19 0341 12/11/19 0400  Weight: 75.6 kg 72.6 kg 68.3 kg    Examination: GEN: Sedated HEENT: ETT tube in place, antiicteric sclera CV: RRR, no mrg PULM: BL vent breath sounds  GI: bowel sounds present  EXT: no edema,  no deformities NEURO: Sedated  SKIN: warm dry   Noted labs: WBC down trending, Mg 2 , Phos 3.5 , CBG at goal, TG 249  Resolved Hospital Problem list   AKI  Shock  Assessment & Plan:  Acute hypoxic respiratory failure  overdose, polysubstance abuse aspiration pneumonia On seroquel, citalopram, and clonazepam  Multiple ggt meds, Versed,  Fentanyl, and Propofol ( Triglycerides 189>249)  Difficult to wean due to agitation.   WBC has trended down , afebrile overnight.  Plan: - Will start and titrate up Precedex, come down on Versed and Propofol - Zosyn x 7 days, stop date 7/13 - Will attempt extubation today if able to wean some of his sedation   Schizophrenia On review of patient record he was treated with Cogentin and Risperdone in the past. These are listed as allergies per chart. He lives with his mother and she reports he has always been non compliant on psychiatric medication.   Addendum:   Patient had a 6 second pause on telemetry and became hypotensive. Precedex started this morning and could likely be not tolerating this medication. He tolerated Precedex on the 10th.  Patient is volume down on admission, stopped lasix. Gave 1 L IV bolus and stopped gtt sedation. Patient's pressure quickly improved. EKG shows sinus rhythm. Will obtained BMP, Mg, CBC, and lactic acid.   Best practice:  Diet: TF Pain/Anxiety/Delirium protocol (if indicated): see above VAP protocol (if indicated): yes DVT prophylaxis: SCDs, heparin GI prophylaxis: PPI Glucose control: SSI Mobility: BR Code Status: Full  Disposition: admit ICU   Thurmon Fair, MD PGY2    PCCM  39 yo M, OD, intubated on MV, requiring excessive sedation, possible aspiration into airway, psyche history   BP 110/61    Pulse (!) 106    Temp 97.9 F (36.6 C)    Resp 17    Ht 5\' 9"  (1.753 m)    Wt 68.3 kg    SpO2 100%    BMI 22.24 kg/m   Gen: young male, agitated on vent  Heart: tachy, RRR, s1 s2 Lungs: CTAB   Labs: reviewed   A: OD Polysubstance abuse  Schizophrenia  AHRF resolved  P: Sedation stopped this morning  Patient extubated today  Observe today in ICU  Stable to transfer to [facility] tomorrow  Needs social work involvement   [facility], DO [place] [facility] 12/11/2019 3:24 PM

## 2019-12-12 DIAGNOSIS — N179 Acute kidney failure, unspecified: Secondary | ICD-10-CM

## 2019-12-12 LAB — CBC
HCT: 34.6 % — ABNORMAL LOW (ref 39.0–52.0)
Hemoglobin: 11.3 g/dL — ABNORMAL LOW (ref 13.0–17.0)
MCH: 28.2 pg (ref 26.0–34.0)
MCHC: 32.7 g/dL (ref 30.0–36.0)
MCV: 86.3 fL (ref 80.0–100.0)
Platelets: 430 10*3/uL — ABNORMAL HIGH (ref 150–400)
RBC: 4.01 MIL/uL — ABNORMAL LOW (ref 4.22–5.81)
RDW: 12.7 % (ref 11.5–15.5)
WBC: 13.6 10*3/uL — ABNORMAL HIGH (ref 4.0–10.5)
nRBC: 0 % (ref 0.0–0.2)

## 2019-12-12 LAB — MAGNESIUM: Magnesium: 2 mg/dL (ref 1.7–2.4)

## 2019-12-12 LAB — BASIC METABOLIC PANEL
Anion gap: 11 (ref 5–15)
BUN: 10 mg/dL (ref 6–20)
CO2: 24 mmol/L (ref 22–32)
Calcium: 9 mg/dL (ref 8.9–10.3)
Chloride: 103 mmol/L (ref 98–111)
Creatinine, Ser: 0.95 mg/dL (ref 0.61–1.24)
GFR calc Af Amer: >60 mL/min (ref >60)
GFR calc non Af Amer: >60 mL/min (ref >60)
Glucose, Bld: 100 mg/dL — ABNORMAL HIGH (ref 70–99)
Potassium: 3.7 mmol/L (ref 3.5–5.1)
Sodium: 138 mmol/L (ref 135–145)

## 2019-12-12 LAB — GLUCOSE, CAPILLARY
Glucose-Capillary: 101 mg/dL — ABNORMAL HIGH (ref 70–99)
Glucose-Capillary: 104 mg/dL — ABNORMAL HIGH (ref 70–99)
Glucose-Capillary: 118 mg/dL — ABNORMAL HIGH (ref 70–99)
Glucose-Capillary: 95 mg/dL (ref 70–99)
Glucose-Capillary: 98 mg/dL (ref 70–99)
Glucose-Capillary: 99 mg/dL (ref 70–99)

## 2019-12-12 LAB — PHOSPHORUS: Phosphorus: 3.4 mg/dL (ref 2.5–4.6)

## 2019-12-12 LAB — TRIGLYCERIDES: Triglycerides: 207 mg/dL — ABNORMAL HIGH (ref <150)

## 2019-12-12 MED ORDER — FOLIC ACID 1 MG PO TABS
1.0000 mg | ORAL_TABLET | Freq: Every day | ORAL | Status: DC
  Administered 2019-12-13: 1 mg via ORAL
  Filled 2019-12-12: qty 1

## 2019-12-12 MED ORDER — ADULT MULTIVITAMIN W/MINERALS CH
1.0000 | ORAL_TABLET | Freq: Every day | ORAL | Status: DC
  Administered 2019-12-13: 1 via ORAL
  Filled 2019-12-12 (×2): qty 1

## 2019-12-12 MED ORDER — PANTOPRAZOLE SODIUM 40 MG PO TBEC
40.0000 mg | DELAYED_RELEASE_TABLET | Freq: Every day | ORAL | Status: DC
  Administered 2019-12-12: 40 mg via ORAL
  Filled 2019-12-12: qty 1

## 2019-12-12 MED ORDER — THIAMINE HCL 100 MG PO TABS
100.0000 mg | ORAL_TABLET | Freq: Every day | ORAL | Status: DC
  Administered 2019-12-13: 100 mg via ORAL
  Filled 2019-12-12: qty 1

## 2019-12-12 MED ORDER — ENSURE ENLIVE PO LIQD: 237.0000 mL | Freq: Two times a day (BID) | ORAL | Status: DC

## 2019-12-12 NOTE — Progress Notes (Signed)
PROGRESS NOTE    Jeffrey Merritt  WIO:[id number] DOB: 02-18-1981 DOA: 12/06/2019 PCP: Patient, No Pcp Per   Brief Narrative: TRH pickup 12/12/2019. 39 year old male admitted 12/06/2019 by PCCM for acute hypoxic respiratory failure secondary to polysubstance abuse and aspiration pneumonia and cocaine overdose intubated by EMS in the [place] was given multiple dose of Narcan without improvement. In the ED it was found that ET tube was in the esophagus, patient had ongoing hypoxia and abdominal distention and stomach contents coming up the ET tube.  His saturations were as low as 20s and became bradycardic.  He was reintubated by the ED physician.  His chest x-ray showed pulmonary edema versus ARDS.  He was started on Zosyn for aspiration.  He was treated with pressors for persistent hypotension.  Urine drug screen positive for cocaine THC and amphetamines.  Initial ABG pH 7.1 5/68/51.  CT head showed no acute abnormality.  Assessment & Plan:   Principal Problem:   Acute respiratory failure with hypoxia and hypercapnia (HCC) Active Problems:   Shock ([place])   Pulmonary edema   Aspiration pneumonia (HCC)   Polysubstance abuse (HCC)   Lactic acidosis   AKI (acute kidney injury) ([place])   Acute encephalopathy   #1  Acute hypoxic respiratory failure secondary to aspiration pneumonia and  overdose and polysubstance abuse cocaine and amphetamines and THC-intubated 12/06/2019 extubated 12/11/2019. On zosyn Started 12/06/2019. Out of bed PT OT consult Case manager/social worker consult  #2  History of schizophrenia-on Celexa 20 mg daily, Klonopin 1 mg twice a day.  Seroquel 50 mg twice a day.  #3 leukocytosis patient being treated for aspiration pneumonia with Zosyn.   Nutrition Problem: Inadequate oral intake Etiology: inability to eat     Signs/Symptoms: NPO status    Interventions: Tube feeding  Estimated body mass index is 22.01 kg/m as calculated from the following:   Height as of this  encounter: '5\' 9"'  (1.753 m).   Weight as of this encounter: 67.6 kg.  DVT prophylaxis: Heparin Code Status: Full code Family Communication: None at bedside Disposition Plan:  Status is: Inpatient   Dispo: The patient is from: Home              Anticipated d/c is to: Unknown              Anticipated d/c date is: Unknown              Patient currently is not medically stable to d/c.  Patient admitted with acute respiratory failure just got extubated yesterday will need PT and OT evaluation on IV antibiotics for aspiration pneumonia leukocytosis   Consultants:   PCCM and neurology  Procedures: Intubated 12/06/2019 extubated 12/11/2019 Right IJ 12/06/2019 Antimicrobials:  Anti-infectives (From admission, onward)   Start     Dose/Rate Route Frequency Ordered Stop   12/06/19 1600  vancomycin (VANCOREADY) IVPB 750 mg/150 mL  Status:  Discontinued        750 mg 150 mL/hr over 60 Minutes Intravenous Every 12 hours 12/06/19 0448 12/06/19 0901   12/06/19 1500  vancomycin (VANCOREADY) IVPB 750 mg/150 mL  Status:  Discontinued        750 mg 150 mL/hr over 60 Minutes Intravenous Every 8 hours 12/06/19 0901 12/07/19 0858   12/06/19 1030  piperacillin-tazobactam (ZOSYN) IVPB 3.375 g     Discontinue     3.375 g 12.5 mL/hr over 240 Minutes Intravenous Every 8 hours 12/06/19 1023 12/13/19 1159   12/06/19 0445  vancomycin (VANCOREADY) IVPB  1500 mg/300 mL        1,500 mg 150 mL/hr over 120 Minutes Intravenous  Once 12/06/19 0433 12/06/19 0842   12/06/19 0215  piperacillin-tazobactam (ZOSYN) IVPB 3.375 g        3.375 g 100 mL/hr over 30 Minutes Intravenous  Once 12/06/19 0201 12/06/19 0418       Subjective: Patient seen and examined discussed with overnight staff and a nurse.  No specific issues overnight reported. He is resting in bed asking for food   Objective: Vitals:   12/12/19 0400 12/12/19 0500 12/12/19 0600 12/12/19 0732  BP: 126/83 119/67 110/66   Pulse: 96 94 (!) 109   Resp: 19 (!)  25 (!) 22   Temp:    97.9 F (36.6 C)  TempSrc:    Oral  SpO2: 100% 99% 100%   Weight:  67.6 kg    Height:        Intake/Output Summary (Last 24 hours) at 12/12/2019 0832 Last data filed at 12/12/2019 0700 Gross per 24 hour  Intake 1489.5 ml  Output 1975 ml  Net -485.5 ml   Filed Weights   12/08/19 0341 12/11/19 0400 12/12/19 0500  Weight: 72.6 kg 68.3 kg 67.6 kg    Examination: Right IJ in place  General exam: Appears calm and comfortable  Respiratory system: Bilateral scattered rhonchi to auscultation. Respiratory effort normal. Cardiovascular system: S1 & S2 heard, RRR. No JVD, murmurs, rubs, gallops or clicks. No pedal edema. Gastrointestinal system: Abdomen is nondistended, soft and nontender. No organomegaly or masses felt. Normal bowel sounds heard. Central nervous system: Alert and oriented. No focal neurological deficits. Extremities: Symmetric 5 x 5 power. Skin: No rashes, lesions or ulcers Psychiatry: Judgement and insight appear normal. Mood & affect appropriate.     Data Reviewed: I have personally reviewed following labs and imaging studies  CBC: Recent Labs  Lab 12/06/19 0201 12/06/19 0236 12/09/19 0630 12/10/19 0500 12/11/19 0350 12/11/19 1037 12/12/19 0404  WBC 9.4   < > 12.6* 11.3* 10.8* 11.2* 13.6*  NEUTROABS 5.7  --   --   --   --   --   --   HGB 11.9*   < > 8.8* 7.8* 10.8* 11.1* 11.3*  HCT 38.8*   < > 27.5* 23.3* 32.9* 35.0* 34.6*  MCV 90.9   < > 88.1 85.7 85.0 86.6 86.3  PLT 359   < > 256 353 376 406* 430*   < > = values in this interval not displayed.   Basic Metabolic Panel: Recent Labs  Lab 12/07/19 1647 12/07/19 1647 12/08/19 0342 12/08/19 0852 12/09/19 0630 12/10/19 0500 12/11/19 0325 12/11/19 1037 12/12/19 0404  NA  --   --   --    < > 137 138 139 139 138  K  --   --   --    < > 3.5 3.5 3.6 3.5 3.7  CL  --   --   --    < > 107 100 100 98 103  CO2  --   --   --    < > '24 27 27 28 24  ' GLUCOSE  --   --   --    < > 131* 97  101* 137* 100*  BUN  --   --   --    < > 5* '9 12 14 10  ' CREATININE  --   --   --    < > 1.00 1.03 1.13 1.26* 0.95  CALCIUM  --   --   --    < >  8.1* 8.8* 9.2 9.2 9.0  MG 1.9   < > 1.7  --   --  1.8 2.0 2.2 2.0  PHOS 2.4*  --  1.1*  --   --  2.5 3.5  --  3.4   < > = values in this interval not displayed.   GFR: Estimated Creatinine Clearance: 99.8 mL/min (by C-G formula based on SCr of 0.95 mg/dL). Liver Function Tests: Recent Labs  Lab 12/06/19 0201 12/06/19 0652  AST 32 39  ALT 23 31  ALKPHOS 66 61  BILITOT 0.5 0.6  PROT 5.2* 5.6*  ALBUMIN 2.5* 2.6*   No results for input(s): LIPASE, AMYLASE in the last 168 hours. Recent Labs  Lab 12/06/19 0337  AMMONIA 18   Coagulation Profile: No results for input(s): INR, PROTIME in the last 168 hours. Cardiac Enzymes: Recent Labs  Lab 12/06/19 0201  CKTOTAL 227   BNP (last 3 results) No results for input(s): PROBNP in the last 8760 hours. HbA1C: No results for input(s): HGBA1C in the last 72 hours. CBG: Recent Labs  Lab 12/11/19 1536 12/11/19 1933 12/11/19 2339 12/12/19 0346 12/12/19 0731  GLUCAP 98 105* 136* 101* 98   Lipid Profile: Recent Labs    12/11/19 0325 12/12/19 0404  TRIG 249* 207*   Thyroid Function Tests: No results for input(s): TSH, T4TOTAL, FREET4, T3FREE, THYROIDAB in the last 72 hours. Anemia Panel: No results for input(s): VITAMINB12, FOLATE, FERRITIN, TIBC, IRON, RETICCTPCT in the last 72 hours. Sepsis Labs: Recent Labs  Lab 12/06/19 0237 12/06/19 0424 12/06/19 0652 12/11/19 1030  PROCALCITON  --   --  5.80  --   LATICACIDVEN 6.2* 1.2 0.9 0.7    Recent Results (from the past 240 hour(s))  SARS Coronavirus 2 by RT PCR (hospital order, performed in [place] hospital lab)     Status: None   Collection Time: 12/06/19  1:55 AM  Result Value Ref Range Status   SARS Coronavirus 2 NEGATIVE NEGATIVE Final    Comment: (NOTE) SARS-CoV-2 target nucleic acids are NOT DETECTED.  The  SARS-CoV-2 RNA is generally detectable in upper and lower respiratory specimens during the acute phase of infection. The lowest concentration of SARS-CoV-2 viral copies this assay can detect is 250 copies / mL. A negative result does not preclude SARS-CoV-2 infection and should not be used as the sole basis for treatment or other patient management decisions.  A negative result may occur with improper specimen collection / handling, submission of specimen other than nasopharyngeal swab, presence of viral mutation(s) within the areas targeted by this assay, and inadequate number of viral copies (<250 copies / mL). A negative result must be combined with clinical observations, patient history, and epidemiological information.  Fact Sheet for Patients:   StrictlyIdeas.no  Fact Sheet for Healthcare Providers: BankingDealers.co.za  This test is not yet approved or  cleared by the [place] FDA and has been authorized for detection and/or diagnosis of SARS-CoV-2 by FDA under an Emergency Use Authorization (EUA).  This EUA will remain in effect (meaning this test can be used) for the duration of the COVID-19 declaration under Section 564(b)(1) of the Act, 21 U.S.C. section 360bbb-3(b)(1), unless the authorization is terminated or revoked sooner.  Performed at [facility], [place] [address], [place], [place] [address]   Culture, blood (Routine X 2) w Reflex to ID Panel     Status: None   Collection Time: 12/06/19  3:37 AM   Specimen: BLOOD  Result Value Ref  Range Status   Specimen Description BLOOD SITE NOT SPECIFIED  Final   Special Requests   Final    BOTTLES DRAWN AEROBIC AND ANAEROBIC Blood Culture adequate volume   Culture   Final    NO GROWTH 5 DAYS Performed at [facility], [address], [place], [place] [address]    Report Status 12/11/2019 FINAL  Final  Culture, blood (Routine X 2) w Reflex to ID Panel      Status: None   Collection Time: 12/06/19  3:42 AM   Specimen: BLOOD  Result Value Ref Range Status   Specimen Description BLOOD RIGHT ANTECUBITAL  Final   Special Requests   Final    BOTTLES DRAWN AEROBIC AND ANAEROBIC Blood Culture adequate volume   Culture   Final    NO GROWTH 5 DAYS Performed at [facility], [place] [address], [place], [place] [address]    Report Status 12/11/2019 FINAL  Final  MRSA PCR Screening     Status: Abnormal   Collection Time: 12/06/19  7:07 AM   Specimen: Nasopharyngeal  Result Value Ref Range Status   MRSA by PCR POSITIVE (A) NEGATIVE Final    Comment:        The GeneXpert MRSA Assay (FDA approved for NASAL specimens only), is one component of a comprehensive MRSA colonization surveillance program. It is not intended to diagnose MRSA infection nor to guide or monitor treatment for MRSA infections. RESULT CALLED TO, READ BACK BY AND VERIFIED WITH: RN Lorraine Lax [phone] 12/06/19 KB Performed at [facility] [address] [place], [place] [address]   Culture, respiratory (non-expectorated)     Status: None   Collection Time: 12/06/19 11:38 AM   Specimen: Tracheal Aspirate; Respiratory  Result Value Ref Range Status   Specimen Description TRACHEAL ASPIRATE  Final   Special Requests NONE  Final   Gram Stain   Final    FEW WBC PRESENT, PREDOMINANTLY PMN NO ORGANISMS SEEN    Culture   Final    RARE Consistent with normal respiratory flora. Performed at [facility], [place] [address], [place], [place] [address]    Report Status 12/08/2019 FINAL  Final  Culture, respiratory (non-expectorated)     Status: None   Collection Time: 12/09/19  8:24 AM   Specimen: Tracheal Aspirate; Respiratory  Result Value Ref Range Status   Specimen Description TRACHEAL ASPIRATE  Final   Special Requests NONE  Final   Gram Stain   Final    ABUNDANT WBC PRESENT,BOTH PMN AND MONONUCLEAR NO ORGANISMS SEEN    Culture   Final    RARE Consistent with  normal respiratory flora. Performed at [facility], [place] [address], [place], [place] [address]    Report Status 12/11/2019 FINAL  Final         Radiology Studies: No results found.      Scheduled Meds: . chlorhexidine gluconate (MEDLINE KIT)  15 mL Mouth Rinse BID  . Chlorhexidine Gluconate Cloth  6 each Topical Daily  . citalopram  20 mg Oral Daily  . clonazePAM  1 mg Oral BID  . feeding supplement (PROSource TF)  45 mL Per Tube Daily  . folic acid  1 mg Intravenous Daily  . heparin injection (subcutaneous)  5,000 Units Subcutaneous Q8H  . insulin aspart  0-9 Units Subcutaneous Q4H  . pantoprazole (PROTONIX) IV  40 mg Intravenous Daily  . QUEtiapine  50 mg Oral BID  . sodium  chloride flush  10-40 mL Intracatheter Q12H  . thiamine  100 mg Intravenous Daily   Continuous Infusions: . sodium chloride Stopped (12/06/19 1120)  . feeding supplement (VITAL 1.5 CAL) 1,000 mL (12/11/19 0745)  . piperacillin-tazobactam (ZOSYN)  IV 12.5 mL/hr at 12/12/19 0600     LOS: 6 days     Georgette Shell, MD 12/12/2019, 8:32 AM

## 2019-12-12 NOTE — Progress Notes (Addendum)
Nutrition Follow-up  DOCUMENTATION CODES:   Not applicable  INTERVENTION:    MVI daily.  Monitor adequacy of oral intake and add PO supplements as needed.  NUTRITION DIAGNOSIS:   Inadequate oral intake related to inability to eat as evidenced by NPO status.  Resolved  GOAL:   Patient will meet greater than or equal to 90% of their needs  Met with current intake of meals  MONITOR:   PO intake, Labs  REASON FOR ASSESSMENT:   Ventilator    ASSESSMENT:   39 year old male who presented on 7/08 after being found unresponsive at local motel with suspected overdose. UDS positive for amphetamines, cocaine, THC. Pt required intubation CXR consistent with ARDS vs pulmonary edema. Pt developed shock and required levophed.  Extubated 7/13. Regular diet. Meal intake: 80% 7/13 dinner. Per RN, patient is consuming 100% of meals, eating independently today.   Labs reviewed. Triglycerides 207 CBG: [phone]  Medications reviewed and include folic acid, novolog, thiamine, IV zosyn.  Weight on admission was 79.3 kg, currently 67.6 kg. I/O -4.6 L  Diet Order:   Diet Order            Diet regular Room service appropriate? Yes; Fluid consistency: Thin  Diet effective now                 EDUCATION NEEDS:   No education needs have been identified at this time  Skin:  Skin Assessment: Reviewed RN Assessment  Last BM:  7/14 type 4  Height:   Ht Readings from Last 1 Encounters:  12/06/19 '5\' 9"'  (1.753 m)    Weight:   Wt Readings from Last 1 Encounters:  12/12/19 67.6 kg    Ideal Body Weight:  72.7 kg  BMI:  Body mass index is 22.01 kg/m.  Estimated Nutritional Needs:   Kcal:  2100-2300  Protein:  90-110 gm  Fluid:  >/= 2.0 L    Lucas Mallow, RD, LDN, CNSC Please refer to Amion for contact information.

## 2019-12-13 LAB — CBC
HCT: 37 % — ABNORMAL LOW (ref 39.0–52.0)
Hemoglobin: 12.3 g/dL — ABNORMAL LOW (ref 13.0–17.0)
MCH: 27.9 pg (ref 26.0–34.0)
MCHC: 33.2 g/dL (ref 30.0–36.0)
MCV: 83.9 fL (ref 80.0–100.0)
Platelets: 476 10*3/uL — ABNORMAL HIGH (ref 150–400)
RBC: 4.41 MIL/uL (ref 4.22–5.81)
RDW: 12.5 % (ref 11.5–15.5)
WBC: 11.7 10*3/uL — ABNORMAL HIGH (ref 4.0–10.5)
nRBC: 0 % (ref 0.0–0.2)

## 2019-12-13 LAB — BASIC METABOLIC PANEL
Anion gap: 11 (ref 5–15)
BUN: 9 mg/dL (ref 6–20)
CO2: 25 mmol/L (ref 22–32)
Calcium: 9.5 mg/dL (ref 8.9–10.3)
Chloride: 100 mmol/L (ref 98–111)
Creatinine, Ser: 0.86 mg/dL (ref 0.61–1.24)
GFR calc Af Amer: >60 mL/min (ref >60)
GFR calc non Af Amer: >60 mL/min (ref >60)
Glucose, Bld: 106 mg/dL — ABNORMAL HIGH (ref 70–99)
Potassium: 4.1 mmol/L (ref 3.5–5.1)
Sodium: 136 mmol/L (ref 135–145)

## 2019-12-13 LAB — GLUCOSE, CAPILLARY
Glucose-Capillary: 111 mg/dL — ABNORMAL HIGH (ref 70–99)
Glucose-Capillary: 135 mg/dL — ABNORMAL HIGH (ref 70–99)
Glucose-Capillary: 114 mg/dL — ABNORMAL HIGH (ref 70–99)

## 2019-12-13 LAB — PHOSPHORUS: Phosphorus: 3 mg/dL (ref 2.5–4.6)

## 2019-12-13 LAB — MAGNESIUM: Magnesium: 2 mg/dL (ref 1.7–2.4)

## 2019-12-13 MED ORDER — FOLIC ACID 1 MG PO TABS: 1.0000 mg | ORAL_TABLET | Freq: Every day | ORAL | Status: AC

## 2019-12-13 MED ORDER — ADULT MULTIVITAMIN W/MINERALS CH: 1.0000 | ORAL_TABLET | Freq: Every day | ORAL | Status: AC

## 2019-12-13 MED ORDER — CITALOPRAM HYDROBROMIDE 20 MG PO TABS: 20.0000 mg | ORAL_TABLET | Freq: Every day | ORAL | 4 refills | Status: AC

## 2019-12-13 NOTE — Progress Notes (Signed)
Pt. Was discharged home via car with mother. Belongings sent with patient. Discharge paperwork was discussed and understood. Pt. Vitals and assessment were stable prior to discharge.

## 2019-12-13 NOTE — Discharge Summary (Signed)
Physician Discharge Summary  Jeffrey Merritt JWJ:[id number] DOB: [birth year] DOA: 12/06/2019  PCP: Patient, No Pcp Per  Admit date: 12/06/2019 Discharge date: 12/13/2019  Admitted From: Home  disposition: Home Recommendations for Outpatient Follow-up:  1. Follow up with PCP in 1-2 weeks 2. Please obtain BMP/CBC in one week  Home Health: None Equipment/Devices none Discharge Condition stable CODE STATUS full code Diet recommendation: Regular Brief/Interim Summary:39 year old male admitted 12/06/2019 by PCCM for acute hypoxic respiratory failure secondary to polysubstance abuse and aspiration pneumonia and cocaine overdose intubated by EMS in the [place] was given multiple dose of Narcan without improvement. In the ED it was found that ET tube was in the esophagus, patient had ongoing hypoxia and abdominal distention and stomach contents coming up the ET tube.  His saturations were as low as 20s and became bradycardic.  He was reintubated by the ED physician.  His chest x-ray showed pulmonary edema versus ARDS.  He was started on Zosyn for aspiration.  He was treated with pressors for persistent hypotension.  Urine drug screen positive for cocaine THC and amphetamines.  Initial ABG pH 7.1 5/68/51.  CT head showed no acute abnormality.  Discharge Diagnoses:  Principal Problem:   Acute respiratory failure with hypoxia and hypercapnia (HCC) Active Problems:   Shock (HCC)   Pulmonary edema   Aspiration pneumonia (HCC)   Polysubstance abuse (HCC)   Lactic acidosis   AKI (acute kidney injury) (HCC)   Acute encephalopathy   #1  Acute hypoxic respiratory failure secondary to aspiration pneumonia and  overdose and polysubstance abuse cocaine and amphetamines and THC-intubated 12/06/2019 extubated 12/11/2019.  Treated with a course of Zosyn.  He will be discharged home today follow-up with [facility]. Patient anxious to go home and to be with his daughter.  #2  History of  schizophrenia-Celexa was started in the hospital.  Continue.   #3 leukocytosis resolved.  Nutrition Problem: Inadequate oral intake Etiology: inability to eat    Signs/Symptoms: NPO status     Interventions: MVI  Estimated body mass index is 23.28 kg/m as calculated from the following:   Height as of this encounter:  (1.753 m).   Weight as of this encounter: 71.5 kg.  Discharge Instructions  Discharge Instructions    Diet - low sodium heart healthy   Complete by: As directed    Increase activity slowly   Complete by: As directed      Allergies as of 12/13/2019      Reactions   Cogentin [benztropine] Anaphylaxis   Risperidone And Related Anaphylaxis      Medication List    TAKE these medications   citalopram 20 MG tablet Commonly known as: CELEXA Take 1 tablet (20 mg total) by mouth daily.   folic acid 1 MG tablet Commonly known as: FOLVITE Take 1 tablet (1 mg total) by mouth daily.   multivitamin with minerals Tabs tablet Take 1 tablet by mouth daily.       Follow-up Information    [facility] Follow up.   Contact information: [address] [place] [address] [phone]             Allergies  Allergen Reactions  . Cogentin [Benztropine] Anaphylaxis  . Risperidone And Related Anaphylaxis    Consultations: PCCM  Procedures/Studies: EEG  Result Date: 12/06/2019 Thana Farr, MD     12/06/2019  3:50 PM ELECTROENCEPHALOGRAM REPORT Patient: Jeffrey Merritt       Room #:  2M08C EEG No. ID: [id number] Age: 39 y.o.        Sex: male Requesting Physician: Ardeth Perfect Report Date:  12/06/2019       Interpreting Physician: Thana Farr History: Jeffrey Merritt is an 39 y.o. male with altered mental status s/p suspected overdose Medications: Vancomycin, Zosyn, Insulin, Levophed Conditions of Recording:  This is a 21 channel routine scalp EEG performed with bipolar and monopolar montages arranged in  accordance to the international 10/20 system of electrode placement. One channel was dedicated to EKG recording. The patient is in the intubated and poorly responsive state. Description:  The background activity is slow and poorly organized.  It consists of a low voltage, polymorphic delta activity that is diffusely distributed and continuous.  The patient is administered multiple forms of stimulation with no change in the background noted.  No epileptiform activity is noted.  Hyperventilation and intermittent photic stimulation were not performed. IMPRESSION: This is an abnormal EEG secondary to general background slowing with no evidence of reactivity.  This finding may be seen with a diffuse disturbance that is etiologically nonspecific, but may include a metabolic encephalopathy, among other possibilities.  No epileptiform activity was noted.  Thana Farr, MD Neurology [phone] 12/06/2019, 11:46 AM   DG Chest 1 View  Result Date: 12/10/2019 CLINICAL DATA:  39 year old male with ARDS. EXAM: CHEST  1 VIEW COMPARISON:  Chest radiograph dated 12/08/2019. FINDINGS: Endotracheal tube above the carina, right IJ central venous line in similar position, and enteric tube extending beyond the inferior margin of the image. Slight interval improvement in bilateral perihilar densities compared to the prior radiograph likely representing improved pulmonary edema. There is no pleural effusion or pneumothorax. The cardiac silhouette is within normal limits. No acute osseous pathology. IMPRESSION: Interval improvement in the bilateral perihilar densities likely representing improved pulmonary edema. Electronically Signed   By: Elgie Collard M.D.   On: 12/10/2019 03:41   DG Abd 1 View  Result Date: 12/06/2019 CLINICAL DATA:  Nasogastric tube placement EXAM: ABDOMEN - 1 VIEW COMPARISON:  None. FINDINGS: The enteric tube loops through the stomach with tip near the pylorus. Airspace disease and right central line is  seen on dedicated chest x-ray. The visualized bowel gas pattern is normal IMPRESSION: The enteric tube tip is near the pylorus. Electronically Signed   By: Marnee Spring M.D.   On: 12/06/2019 07:23   CT HEAD WO CONTRAST  Result Date: 12/06/2019 CLINICAL DATA:  Unresponsive, possible fall EXAM: CT HEAD WITHOUT CONTRAST CT CERVICAL SPINE WITHOUT CONTRAST TECHNIQUE: Multidetector CT imaging of the head and cervical spine was performed following the standard protocol without intravenous contrast. Multiplanar CT image reconstructions of the cervical spine were also generated. COMPARISON:  None. FINDINGS: CT HEAD FINDINGS Brain: No evidence of acute infarction, hemorrhage, hydrocephalus, extra-axial collection or mass lesion/mass effect. Basal cisterns are patent. Cerebellar tonsils are normally position. Few benign appearing dural calcifications. Vascular: No hyperdense vessel or unexpected calcification. Skull: Few sites of mild scalp infiltration over the left temporal and parietal scalp. No large hematoma. No calvarial fracture or acute osseous injury. Remote appearing right lamina papyracea fracture. Additional remote appearing fracture of the right zygomatic arch. Age indeterminate nasal bone deformities, favor remote as well. Sinuses/Orbits: Patient is intubated with transesophageal and endotracheal tubes at the time of exam. Some mild thickening in the nasal passages and ethmoids likely related to this instrumentation. Secretions noted in the posterior naso and oropharynx. Included orbital structures are unremarkable. Other: None CT CERVICAL SPINE FINDINGS  Alignment: Cervical stabilization collar is in place at the time of examination. Mild leftward cranial rotation despite the stabilization device. No evidence of traumatic listhesis. No abnormally widened, perched or jumped facets. Normal alignment of the craniocervical and atlantoaxial articulations accounting for rotation. Skull base and vertebrae: No  acute skull base fracture. No vertebral body fracture or height loss is seen. Irregular sclerosis and osteophyte formation about the C5-6 vertebral bodies anteriorly favored to be degenerative or less likely remote posttraumatic in nature. Soft tissues and spinal canal: No pre or paravertebral fluid or swelling. No visible canal hematoma. Elongated styloid processes with calcification of the stylohyoid ligaments. Disc levels: Multilevel intervertebral disc height loss with spondylitic endplate changes. Few mild posterior disc osteophyte complexes without significant canal stenosis or neural foraminal narrowing in the cervical spine. Upper chest: Ground-glass and consolidative opacities in the lungs with some associated septal thickening could reflect interstitial edema though infection, aspiration or ARDS would present with a similar appearance. Endotracheal tube terminates in the mid to upper trachea. Transesophageal tube continues below the level of imaging. Other: A plate and screw fixation of the remote left mandibular fracture is noted. IMPRESSION: 1. No acute intracranial abnormality. 2. Few sites of mild scalp infiltration over the left temporal and parietal scalp. No large hematoma or calvarial fracture. 3. No evidence of acute fracture or traumatic listhesis of the cervical spine. 4. Multilevel degenerative changes of the cervical spine, as above. 5. Ground-glass and consolidative opacities in the lungs with some associated septal thickening could reflect interstitial edema though infection or ARDS would present with a similar appearance. 6. Remote appearing right lamina papyracea fracture. Additional remote appearing fracture of the right zygomatic arch and left mandible. Age indeterminate nasal bone deformities, favor remote as well though could correlate for point tenderness. 7. 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  Electronically Signed   By: Kreg Shropshire M.D.   On: 12/06/2019 02:51   CT CERVICAL SPINE WO CONTRAST  Result Date: 12/06/2019 CLINICAL DATA:  Unresponsive, possible fall EXAM: CT HEAD WITHOUT CONTRAST CT CERVICAL SPINE WITHOUT CONTRAST TECHNIQUE: Multidetector CT imaging of the head and cervical spine was performed following the standard protocol without intravenous contrast. Multiplanar CT image reconstructions of the cervical spine were also generated. COMPARISON:  None. FINDINGS: CT HEAD FINDINGS Brain: No evidence of acute infarction, hemorrhage, hydrocephalus, extra-axial collection or mass lesion/mass effect. Basal cisterns are patent. Cerebellar tonsils are normally position. Few benign appearing dural calcifications. Vascular: No hyperdense vessel or unexpected calcification. Skull: Few sites of mild scalp infiltration over the left temporal and parietal scalp. No large hematoma. No calvarial fracture or acute osseous injury. Remote appearing right lamina papyracea fracture. Additional remote appearing fracture of the right zygomatic arch. Age indeterminate nasal bone deformities, favor remote as well. Sinuses/Orbits: Patient is intubated with transesophageal and endotracheal tubes at the time of exam. Some mild thickening in the nasal passages and ethmoids likely related to this instrumentation. Secretions noted in the posterior naso and oropharynx. Included orbital structures are unremarkable. Other: None CT CERVICAL SPINE FINDINGS Alignment: Cervical stabilization collar is in place at the time of examination. Mild leftward cranial rotation despite the stabilization device. No evidence of traumatic listhesis. No abnormally widened, perched or jumped facets. Normal alignment of the craniocervical and atlantoaxial articulations accounting for rotation. Skull base and vertebrae: No acute skull base fracture. No vertebral body fracture or height loss is seen. Irregular sclerosis  and osteophyte formation  about the C5-6 vertebral bodies anteriorly favored to be degenerative or less likely remote posttraumatic in nature. Soft tissues and spinal canal: No pre or paravertebral fluid or swelling. No visible canal hematoma. Elongated styloid processes with calcification of the stylohyoid ligaments. Disc levels: Multilevel intervertebral disc height loss with spondylitic endplate changes. Few mild posterior disc osteophyte complexes without significant canal stenosis or neural foraminal narrowing in the cervical spine. Upper chest: Ground-glass and consolidative opacities in the lungs with some associated septal thickening could reflect interstitial edema though infection, aspiration or ARDS would present with a similar appearance. Endotracheal tube terminates in the mid to upper trachea. Transesophageal tube continues below the level of imaging. Other: A plate and screw fixation of the remote left mandibular fracture is noted. IMPRESSION: 1. No acute intracranial abnormality. 2. Few sites of mild scalp infiltration over the left temporal and parietal scalp. No large hematoma or calvarial fracture. 3. No evidence of acute fracture or traumatic listhesis of the cervical spine. 4. Multilevel degenerative changes of the cervical spine, as above. 5. Ground-glass and consolidative opacities in the lungs with some associated septal thickening could reflect interstitial edema though infection or ARDS would present with a similar appearance. 6. Remote appearing right lamina papyracea fracture. Additional remote appearing fracture of the right zygomatic arch and left mandible. Age indeterminate nasal bone deformities, favor remote as well though could correlate for point tenderness. 7. 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 Electronically Signed   By: Kreg Shropshire M.D.   On: 12/06/2019 02:51   MR BRAIN W WO CONTRAST  Result Date:  12/09/2019 CLINICAL DATA:  Acute respiratory failure with hypoxia. Anoxic brain injury suspected. EXAM: MRI HEAD WITHOUT AND WITH CONTRAST TECHNIQUE: Multiplanar, multiecho pulse sequences of the brain and surrounding structures were obtained without and with intravenous contrast. CONTRAST:  7.17mL GADAVIST GADOBUTROL 1 MMOL/ML IV SOLN COMPARISON:  MRI 12/06/2019.  CT 12/06/2019. FINDINGS: Brain: Diffusion imaging does not show any acute or subacute infarction. No evidence of focal insult or widespread anoxic injury. Brainstem and cerebellum are normal. Cerebral hemispheres are normal. No old small or large vessel insult. No mass lesion, hemorrhage, hydrocephalus or extra-axial collection. Vascular: Major vessels at the base of the brain show flow. Skull and upper cervical spine: Negative Sinuses/Orbits: Fluid in the nasopharynx, presumably seen in association with intubation. Bilateral mastoid effusions. No acute orbit finding. Old medial wall blowout fracture on the right. Other: None IMPRESSION: Continued normal appearance of the brain. No evidence of hypoxic ischemic injury or other focal insult. Electronically Signed   By: Paulina Fusi M.D.   On: 12/09/2019 16:53   MR BRAIN W WO CONTRAST  Result Date: 12/06/2019 CLINICAL DATA:  Head trauma with delayed recovery EXAM: MRI HEAD WITHOUT AND WITH CONTRAST TECHNIQUE: Multiplanar, multiecho pulse sequences of the brain and surrounding structures were obtained without and with intravenous contrast. CONTRAST:  7.67mL GADAVIST GADOBUTROL 1 MMOL/ML IV SOLN COMPARISON:  Correlation made with prior CT imaging FINDINGS: Brain: There is no acute infarction or intracranial hemorrhage. There is no intracranial mass, mass effect, or edema. There is no hydrocephalus or extra-axial fluid collection. Ventricles and sulci are normal in size and configuration. No abnormal enhancement. Vascular: Major vessel flow voids at the skull base are preserved. Skull and upper cervical spine:  Normal marrow signal is preserved. Sinuses/Orbits: Patchy mucosal thickening.  Orbits are unremarkable. Other: Sella is unremarkable.  Patchy mastoid fluid opacification. IMPRESSION: No significant abnormality. Electronically Signed   By: Guadlupe SpanishPraneil  Patel M.D.   On: 12/06/2019 17:12   MR CERVICAL SPINE WO CONTRAST  Result Date: 12/06/2019 CLINICAL DATA:  Neck trauma, negative CT EXAM: MRI CERVICAL SPINE WITHOUT CONTRAST TECHNIQUE: Multiplanar, multisequence MR imaging of the cervical spine was performed. No intravenous contrast was administered. COMPARISON:  None. FINDINGS: Alignment: Anteroposterior alignment is maintained. Vertebrae: Vertebral body heights are maintained. There is no marrow edema. There is no suspicious osseous lesion. Cord: No abnormal signal. Posterior Fossa, vertebral arteries, paraspinal tissues: No evidence of ligamentous injury. Intracranial findings dictated separately. Major neck vessel flow voids are preserved. There is no prevertebral edema. Endotracheal and enteric tubes are present with retained secretions within the pharynx. Disc levels: C2-C3:  No significant stenosis. C3-C4:  Small central disc protrusion.  No significant stenosis. C4-C5: Small central disc protrusion eccentric to the right. Mild canal stenosis. No significant foraminal stenosis C5-C6: Mild disc bulge with small central protrusion and endplate osteophytes. Minor canal stenosis. No significant right foraminal stenosis. Moderate left foraminal stenosis. C6-C7: Mild disc bulge with superimposed right paracentral/foraminal protrusion and endplate osteophytes. No significant canal stenosis. Moderate right and minor left foraminal stenosis. C7-T1:  No significant stenosis. IMPRESSION: No evidence of soft tissue or ligamentous injury. Degenerative changes as detailed above. Electronically Signed   By: Guadlupe SpanishPraneil  Patel M.D.   On: 12/06/2019 16:42   Portable Chest x-ray  Result Date: 12/08/2019 CLINICAL DATA:   Endotracheal tube present.  Hypoxemia. EXAM: PORTABLE CHEST 1 VIEW COMPARISON:  12/08/2019 FINDINGS: Endotracheal tube is been advanced, with tip now approximately 4 cm above the carina. Nasogastric tube and right jugular central venous catheter remain in appropriate position. Bilateral central pulmonary airspace disease is again seen in batwing distribution. Mild worsening of airspace disease is seen in the central right upper lobe. This is likely due to acute pulmonary edema. No evidence of pneumothorax or pleural effusion. Heart size is normal. IMPRESSION: Bilateral central pulmonary airspace disease shows worsening in the central right upper lobe but remains relatively symmetric, likely due to acute pulmonary edema. Electronically Signed   By: Danae OrleansJohn A Stahl M.D.   On: 12/08/2019 12:19   DG CHEST PORT 1 VIEW  Result Date: 12/08/2019 CLINICAL DATA:  Hypoxemia EXAM: PORTABLE CHEST 1 VIEW COMPARISON:  Chest x-ray dated 12/06/2019. FINDINGS: Improved aeration bilaterally indicating decreased pulmonary edema. No pleural effusion or pneumothorax is seen. Heart size and mediastinal contours are within normal limits. RIGHT IJ central line is stable in position with tip at the level of the upper SVC. Enteric tube passes below the diaphragm. IMPRESSION: Improved aeration bilaterally indicating decreased pulmonary edema. Electronically Signed   By: Bary RichardStan  Maynard M.D.   On: 12/08/2019 06:59   DG Chest Portable 1 View  Result Date: 12/06/2019 CLINICAL DATA:  Status post central line placement EXAM: PORTABLE CHEST 1 VIEW COMPARISON:  Film from earlier in the same day. FINDINGS: Cardiac shadow is within normal limits. Endotracheal tube and gastric catheter are seen in satisfactory position. New right jugular central line is noted with the catheter tip in the mid superior vena cava. Persistent and increased bilateral parenchymal opacities are noted most consistent with pulmonary edema. No pneumothorax is noted. IMPRESSION:  No pneumothorax following central line placement. Increasing central pulmonary edema. Electronically Signed   By: Alcide CleverMark  Lukens M.D.   On: 12/06/2019 03:37   DG Chest Port 1 View  Result Date: 12/06/2019 CLINICAL DATA:  Possible drug overdose EXAM:  PORTABLE CHEST 1 VIEW COMPARISON:  None. FINDINGS: Endotracheal tube tip is at the level of the clavicular heads. Enteric tube courses below the field of view. There are hazy bilateral upper lobe predominant opacities, likely pulmonary edema. No pleural effusion or pneumothorax. IMPRESSION: Endotracheal tube tip at the level of the clavicular heads. Bilateral upper lobe predominant opacities, likely pulmonary edema. Electronically Signed   By: Deatra Robinson M.D.   On: 12/06/2019 02:23   DG Abd Portable 1V  Result Date: 12/07/2019 CLINICAL DATA:  Ileus. EXAM: PORTABLE ABDOMEN - 1 VIEW COMPARISON:  December 06, 2019 FINDINGS: A nasogastric tube is seen with its distal end looped within the body of the stomach. The bowel gas pattern is normal. No radio-opaque calculi or other significant radiographic abnormality are seen. Subcentimeter phleboliths are seen within the pelvis. IMPRESSION: 1. No evidence of bowel obstruction. 2. Nasogastric tube positioning, as described above. Electronically Signed   By: Aram Candela M.D.   On: 12/07/2019 20:49   ECHOCARDIOGRAM COMPLETE  Result Date: 12/06/2019    ECHOCARDIOGRAM REPORT   Patient Name:   NAVARRO NINE Date of Exam: 12/06/2019 Medical Rec #:  [id number]    Height:       69.0 in Accession #:    [id number]   Weight:       165.6 lb Date of Birth:  [birth year]    BSA:          1.907 m Patient Age:    38 years     BP:           110/68 mmHg Patient Gender: M            HR:           115 bpm. Exam Location:  Inpatient Procedure: 2D Echo, Cardiac Doppler, Color Doppler and Intracardiac            Opacification Agent Indications:    shock  History:        Patient has no prior history of Echocardiogram examinations.  Sonographer:     Renella Cunas RDCS Referring Phys: [id number] PAULA B SIMPSON  Sonographer Comments: Echo performed with patient supine and on artificial respirator and Technically difficult study due to poor echo windows. IMPRESSIONS  1. Left ventricular ejection fraction, by estimation, is 50 to 55%. The left ventricle has low normal function. Left ventricular endocardial border not optimally defined to evaluate regional wall motion. Left ventricular diastolic parameters are indeterminate.  2. Right ventricular systolic function is normal. The right ventricular size is normal. Tricuspid regurgitation signal is inadequate for assessing PA pressure.  3. The mitral valve is normal in structure. No evidence of mitral valve regurgitation. No evidence of mitral stenosis.  4. The aortic valve was not well visualized. Aortic valve regurgitation is not visualized. No aortic stenosis is present.  5. The inferior vena cava is dilated in size with <50% respiratory variability, suggesting right atrial pressure of 15 mmHg.  6. Technically difficult study with poor acoustic windows. FINDINGS  Left Ventricle: Left ventricular ejection fraction, by estimation, is 50 to 55%. The left ventricle has low normal function. Left ventricular endocardial border not optimally defined to evaluate regional wall motion. Definity contrast agent was given IV  to delineate the left ventricular endocardial borders. The left ventricular internal cavity size was normal in size. There is no left ventricular hypertrophy. Left ventricular diastolic parameters are indeterminate. Right Ventricle: The right ventricular size is normal. No increase in right ventricular wall thickness. Right ventricular  systolic function is normal. Tricuspid regurgitation signal is inadequate for assessing PA pressure. Left Atrium: Left atrial size was normal in size. Right Atrium: Right atrial size was normal in size. Pericardium: There is no evidence of pericardial effusion. Mitral Valve: The  mitral valve is normal in structure. No evidence of mitral valve regurgitation. No evidence of mitral valve stenosis. Tricuspid Valve: The tricuspid valve is normal in structure. Tricuspid valve regurgitation is not demonstrated. Aortic Valve: The aortic valve was not well visualized. Aortic valve regurgitation is not visualized. No aortic stenosis is present. Pulmonic Valve: The pulmonic valve was not well visualized. Pulmonic valve regurgitation is not visualized. Aorta: The aortic root is normal in size and structure. Venous: The inferior vena cava is dilated in size with less than 50% respiratory variability, suggesting right atrial pressure of 15 mmHg. IAS/Shunts: No atrial level shunt detected by color flow Doppler.  LEFT VENTRICLE PLAX 2D LVIDd:         4.40 cm  Diastology LVIDs:         4.00 cm  LV e' lateral:   10.60 cm/s LV PW:         0.60 cm  LV E/e' lateral: 5.1 LV IVS:        0.60 cm  LV e' medial:    5.34 cm/s LVOT diam:     1.90 cm  LV E/e' medial:  10.0 LV SV:         34 LV SV Index:   18 LVOT Area:     2.84 cm  RIGHT VENTRICLE RV S prime:     6.53 cm/s TAPSE (M-mode): 0.8 cm LEFT ATRIUM             Index      RIGHT ATRIUM          Index LA diam:        3.30 cm 1.73 cm/m RA Area:     6.49 cm LA Vol (A2C):   14.8 ml 7.76 ml/m RA Volume:   9.58 ml  5.02 ml/m LA Vol (A4C):   9.2 ml  4.80 ml/m LA Biplane Vol: 11.7 ml 6.14 ml/m  AORTIC VALVE LVOT Vmax:   76.40 cm/s LVOT Vmean:  58.000 cm/s LVOT VTI:    0.120 m  AORTA Ao Root diam: 2.90 cm MITRAL VALVE MV Area (PHT): 5.88 cm    SHUNTS MV Decel Time: 129 msec    Systemic VTI:  0.12 m MV E velocity: 53.60 cm/s  Systemic Diam: 1.90 cm MV A velocity: 44.10 cm/s MV E/A ratio:  1.22 Marca Ancona MD Electronically signed by Marca Ancona MD Signature Date/Time: 12/06/2019/3:55:02 PM    Final     (Echo, Carotid, EGD, Colonoscopy, ERCP)    Subjective:  He is awake alert eating breakfast in bed no new complaints had a good night.  Anxious to go  home. Discharge Exam: Vitals:   12/13/19 0603 12/13/19 0730  BP: 115/64   Pulse: 92   Resp: (!) 21   Temp:  97.8 F (36.6 C)  SpO2: 100%    Vitals:   12/13/19 0420 12/13/19 0500 12/13/19 0603 12/13/19 0730  BP:  108/72 115/64   Pulse:  85 92   Resp:  (!) 32 (!) 21   Temp:    97.8 F (36.6 C)  TempSrc:    Oral  SpO2:  100% 100%   Weight: 71.5 kg     Height:        General: Pt is  alert, awake, not in acute distress Cardiovascular: RRR, S1/S2 +, no rubs, no gallops Respiratory: CTA bilaterally, no wheezing, no rhonchi Abdominal: Soft, NT, ND, bowel sounds + Extremities: no edema, no cyanosis    The results of significant diagnostics from this hospitalization (including imaging, microbiology, ancillary and laboratory) are listed below for reference.     Microbiology: Recent Results (from the past 240 hour(s))  SARS Coronavirus 2 by RT PCR (hospital order, performed in [facility] hospital lab)     Status: None   Collection Time: 12/06/19  1:55 AM  Result Value Ref Range Status   SARS Coronavirus 2 NEGATIVE NEGATIVE Final    Comment: (NOTE) SARS-CoV-2 target nucleic acids are NOT DETECTED.  The SARS-CoV-2 RNA is generally detectable in upper and lower respiratory specimens during the acute phase of infection. The lowest concentration of SARS-CoV-2 viral copies this assay can detect is 250 copies / mL. A negative result does not preclude SARS-CoV-2 infection and should not be used as the sole basis for treatment or other patient management decisions.  A negative result may occur with improper specimen collection / handling, submission of specimen other than nasopharyngeal swab, presence of viral mutation(s) within the areas targeted by this assay, and inadequate number of viral copies (<250 copies / mL). A negative result must be combined with clinical observations, patient history, and epidemiological information.  Fact Sheet for Patients:    BoilerBrush.com.cy  Fact Sheet for Healthcare Providers: https://pope.com/  This test is not yet approved or  cleared by the [place] FDA and has been authorized for detection and/or diagnosis of SARS-CoV-2 by FDA under an Emergency Use Authorization (EUA).  This EUA will remain in effect (meaning this test can be used) for the duration of the COVID-19 declaration under Section 564(b)(1) of the Act, 21 U.S.C. section 360bbb-3(b)(1), unless the authorization is terminated or revoked sooner.  Performed at [facility], [address], [place], [place] [address]   Culture, blood (Routine X 2) w Reflex to ID Panel     Status: None   Collection Time: 12/06/19  3:37 AM   Specimen: BLOOD  Result Value Ref Range Status   Specimen Description BLOOD SITE NOT SPECIFIED  Final   Special Requests   Final    BOTTLES DRAWN AEROBIC AND ANAEROBIC Blood Culture adequate volume   Culture   Final    NO GROWTH 5 DAYS Performed at [facility], [address], [place], [place] [address]    Report Status 12/11/2019 FINAL  Final  Culture, blood (Routine X 2) w Reflex to ID Panel     Status: None   Collection Time: 12/06/19  3:42 AM   Specimen: BLOOD  Result Value Ref Range Status   Specimen Description BLOOD RIGHT ANTECUBITAL  Final   Special Requests   Final    BOTTLES DRAWN AEROBIC AND ANAEROBIC Blood Culture adequate volume   Culture   Final    NO GROWTH 5 DAYS Performed at [facility], [address] [place], [place] [address]    Report Status 12/11/2019 FINAL  Final  MRSA PCR Screening     Status: Abnormal   Collection Time: 12/06/19  7:07 AM   Specimen: Nasopharyngeal  Result Value Ref Range Status   MRSA by PCR POSITIVE (A) NEGATIVE Final    Comment:        The GeneXpert MRSA Assay (FDA approved for NASAL specimens only), is one component of a comprehensive MRSA colonization  surveillance program. It is  not intended to diagnose MRSA infection nor to guide or monitor treatment for MRSA infections. RESULT CALLED TO, READ BACK BY AND VERIFIED WITH: RN Lorine Bears [phone] 12/06/19 KB Performed at [facility], [address], [place], [place] [address]   Culture, respiratory (non-expectorated)     Status: None   Collection Time: 12/06/19 11:38 AM   Specimen: Tracheal Aspirate; Respiratory  Result Value Ref Range Status   Specimen Description TRACHEAL ASPIRATE  Final   Special Requests NONE  Final   Gram Stain   Final    FEW WBC PRESENT, PREDOMINANTLY PMN NO ORGANISMS SEEN    Culture   Final    RARE Consistent with normal respiratory flora. Performed at [facility], [address], [place], [place] [address]    Report Status 12/08/2019 FINAL  Final  Culture, respiratory (non-expectorated)     Status: None   Collection Time: 12/09/19  8:24 AM   Specimen: Tracheal Aspirate; Respiratory  Result Value Ref Range Status   Specimen Description TRACHEAL ASPIRATE  Final   Special Requests NONE  Final   Gram Stain   Final    ABUNDANT WBC PRESENT,BOTH PMN AND MONONUCLEAR NO ORGANISMS SEEN    Culture   Final    RARE Consistent with normal respiratory flora. Performed at [facility], [address], [place], [place] [address]    Report Status 12/11/2019 FINAL  Final     Labs: BNP (last 3 results) Recent Labs    12/06/19 0652  BNP 20.9   Basic Metabolic Panel: Recent Labs  Lab 12/08/19 0342 12/08/19 0852 12/10/19 0500 12/11/19 0325 12/11/19 1037 12/12/19 0404 12/13/19 0643  NA  --    < > 138 139 139 138 136  K  --    < > 3.5 3.6 3.5 3.7 4.1  CL  --    < > 100 100 98 103 100  CO2  --    < > GLUCOSE  --    < > 97 101* 137* 100* 106*  BUN  --    < > CREATININE  --    < > 1.03 1.13 1.26* 0.95 0.86  CALCIUM  --    < > 8.8* 9.2 9.2 9.0 9.5  MG 1.7  --  1.8 2.0 2.2 2.0 2.0  PHOS 1.1*  --  2.5 3.5  --  3.4 3.0   < > = values in  this interval not displayed.   Liver Function Tests: No results for input(s): AST, ALT, ALKPHOS, BILITOT, PROT, ALBUMIN in the last 168 hours. No results for input(s): LIPASE, AMYLASE in the last 168 hours. No results for input(s): AMMONIA in the last 168 hours. CBC: Recent Labs  Lab 12/10/19 0500 12/11/19 0350 12/11/19 1037 12/12/19 0404 12/13/19 0643  WBC 11.3* 10.8* 11.2* 13.6* 11.7*  HGB 7.8* 10.8* 11.1* 11.3* 12.3*  HCT 23.3* 32.9* 35.0* 34.6* 37.0*  MCV 85.7 85.0 86.6 86.3 83.9  PLT 353 376 406* 430* 476*   Cardiac Enzymes: No results for input(s): CKTOTAL, CKMB, CKMBINDEX, TROPONINI in the last 168 hours. BNP: Invalid input(s): POCBNP CBG: Recent Labs  Lab 12/12/19 1522 12/12/19 1921 12/12/19 2337 12/13/19 0326 12/13/19 0726  GLUCAP 118* 99 104* 111* 135*   D-Dimer No results for input(s): DDIMER in the last 72 hours. Hgb A1c No results for input(s): HGBA1C in the last 72 hours.  Lipid Profile Recent Labs    12/11/19 0325 12/12/19 0404  TRIG 249* 207*   Thyroid function studies No results for input(s): TSH, T4TOTAL, T3FREE, THYROIDAB in the last 72 hours.  Invalid input(s): FREET3 Anemia work up No results for input(s): VITAMINB12, FOLATE, FERRITIN, TIBC, IRON, RETICCTPCT in the last 72 hours. Urinalysis    Component Value Date/Time   COLORURINE YELLOW 12/06/2019 1407   APPEARANCEUR CLEAR 12/06/2019 1407   LABSPEC 1.019 12/06/2019 1407   PHURINE 5.0 12/06/2019 1407   GLUCOSEU NEGATIVE 12/06/2019 1407   HGBUR MODERATE (A) 12/06/2019 1407   BILIRUBINUR NEGATIVE 12/06/2019 1407   KETONESUR NEGATIVE 12/06/2019 1407   PROTEINUR NEGATIVE 12/06/2019 1407   NITRITE NEGATIVE 12/06/2019 1407   LEUKOCYTESUR NEGATIVE 12/06/2019 1407   Sepsis Labs Invalid input(s): PROCALCITONIN,  WBC,  LACTICIDVEN Microbiology Recent Results (from the past 240 hour(s))  SARS Coronavirus 2 by RT PCR (hospital order, performed in [facility] hospital lab)     Status: None    Collection Time: 12/06/19  1:55 AM  Result Value Ref Range Status   SARS Coronavirus 2 NEGATIVE NEGATIVE Final    Comment: (NOTE) SARS-CoV-2 target nucleic acids are NOT DETECTED.  The SARS-CoV-2 RNA is generally detectable in upper and lower respiratory specimens during the acute phase of infection. The lowest concentration of SARS-CoV-2 viral copies this assay can detect is 250 copies / mL. A negative result does not preclude SARS-CoV-2 infection and should not be used as the sole basis for treatment or other patient management decisions.  A negative result may occur with improper specimen collection / handling, submission of specimen other than nasopharyngeal swab, presence of viral mutation(s) within the areas targeted by this assay, and inadequate number of viral copies (<250 copies / mL). A negative result must be combined with clinical observations, patient history, and epidemiological information.  Fact Sheet for Patients:   BoilerBrush.com.cy  Fact Sheet for Healthcare Providers: https://pope.com/  This test is not yet approved or  cleared by the [place] FDA and has been authorized for detection and/or diagnosis of SARS-CoV-2 by FDA under an Emergency Use Authorization (EUA).  This EUA will remain in effect (meaning this test can be used) for the duration of the COVID-19 declaration under Section 564(b)(1) of the Act, 21 U.S.C. section 360bbb-3(b)(1), unless the authorization is terminated or revoked sooner.  Performed at [facility], [address] [place], [place] [address]   Culture, blood (Routine X 2) w Reflex to ID Panel     Status: None   Collection Time: 12/06/19  3:37 AM   Specimen: BLOOD  Result Value Ref Range Status   Specimen Description BLOOD SITE NOT SPECIFIED  Final   Special Requests   Final    BOTTLES DRAWN AEROBIC AND ANAEROBIC Blood Culture adequate volume   Culture   Final    NO  GROWTH 5 DAYS Performed at [facility], [address] [place], [place] [address]    Report Status 12/11/2019 FINAL  Final  Culture, blood (Routine X 2) w Reflex to ID Panel     Status: None   Collection Time: 12/06/19  3:42 AM   Specimen: BLOOD  Result Value Ref Range Status   Specimen Description BLOOD RIGHT ANTECUBITAL  Final   Special Requests   Final    BOTTLES DRAWN AEROBIC AND ANAEROBIC Blood Culture adequate volume   Culture   Final    NO GROWTH 5 DAYS Performed at [facility],  [address] [place], [place] [address]    Report Status 12/11/2019 FINAL  Final  MRSA PCR Screening     Status: Abnormal   Collection Time: 12/06/19  7:07 AM   Specimen: Nasopharyngeal  Result Value Ref Range Status   MRSA by PCR POSITIVE (A) NEGATIVE Final    Comment:        The GeneXpert MRSA Assay (FDA approved for NASAL specimens only), is one component of a comprehensive MRSA colonization surveillance program. It is not intended to diagnose MRSA infection nor to guide or monitor treatment for MRSA infections. RESULT CALLED TO, READ BACK BY AND VERIFIED WITH: RN Lorine Bears [phone] 12/06/19 KB Performed at [facility], [address] [place], [place] [address]   Culture, respiratory (non-expectorated)     Status: None   Collection Time: 12/06/19 11:38 AM   Specimen: Tracheal Aspirate; Respiratory  Result Value Ref Range Status   Specimen Description TRACHEAL ASPIRATE  Final   Special Requests NONE  Final   Gram Stain   Final    FEW WBC PRESENT, PREDOMINANTLY PMN NO ORGANISMS SEEN    Culture   Final    RARE Consistent with normal respiratory flora. Performed at [facility], [address] [place], [place] [address]    Report Status 12/08/2019 FINAL  Final  Culture, respiratory (non-expectorated)     Status: None   Collection Time: 12/09/19  8:24 AM   Specimen: Tracheal Aspirate; Respiratory  Result Value Ref Range Status   Specimen Description  TRACHEAL ASPIRATE  Final   Special Requests NONE  Final   Gram Stain   Final    ABUNDANT WBC PRESENT,BOTH PMN AND MONONUCLEAR NO ORGANISMS SEEN    Culture   Final    RARE Consistent with normal respiratory flora. Performed at [facility], [address], [place], [place] [address]    Report Status 12/11/2019 FINAL  Final     Time coordinating discharge:   SIGNED:   Alwyn Ren, MD  [facility] 12/13/2019, 8:58 AM Pager   If 7PM-7AM, please contact night-coverage [email or web address] Password [email or web address]

## 2019-12-13 NOTE — Plan of Care (Signed)
  Problem: Activity: Goal: Ability to tolerate increased activity will improve Outcome: Adequate for Discharge   Problem: Respiratory: Goal: Ability to maintain a clear airway and adequate ventilation will improve Outcome: Adequate for Discharge   Problem: Role Relationship: Goal: Method of communication will improve Outcome: Adequate for Discharge   Problem: Education: Goal: Knowledge of General Education information will improve Description: Including pain rating scale, medication(s)/side effects and non-pharmacologic comfort measures Outcome: Adequate for Discharge   Problem: Health Behavior/Discharge Planning: Goal: Ability to manage health-related needs will improve Outcome: Adequate for Discharge   Problem: Clinical Measurements: Goal: Ability to maintain clinical measurements within normal limits will improve Outcome: Adequate for Discharge Goal: Will remain free from infection Outcome: Adequate for Discharge Goal: Diagnostic test results will improve Outcome: Adequate for Discharge Goal: Respiratory complications will improve Outcome: Adequate for Discharge Goal: Cardiovascular complication will be avoided Outcome: Adequate for Discharge   Problem: Activity: Goal: Risk for activity intolerance will decrease Outcome: Adequate for Discharge   Problem: Nutrition: Goal: Adequate nutrition will be maintained Outcome: Adequate for Discharge   Problem: Coping: Goal: Level of anxiety will decrease Outcome: Adequate for Discharge   Problem: Elimination: Goal: Will not experience complications related to bowel motility Outcome: Adequate for Discharge Goal: Will not experience complications related to urinary retention Outcome: Adequate for Discharge   Problem: Pain Managment: Goal: General experience of comfort will improve Outcome: Adequate for Discharge   Problem: Safety: Goal: Ability to remain free from injury will improve Outcome: Adequate for Discharge    Problem: Skin Integrity: Goal: Risk for impaired skin integrity will decrease Outcome: Adequate for Discharge   

## 2019-12-13 NOTE — TOC Transition Note (Signed)
Transition of Care [facility]) - CM/SW Discharge Note   Patient Details  Name: Delvin Hedeen MRN: [id number] Date of Birth: 1980-07-15  Transition of Care [facility]) CM/SW Contact:  Durenda Guthrie, RN Phone Number: [phone] (working remotely) 12/13/2019, 12:12 PM   Clinical Narrative:   Case manager spoke with patient via telephone to discuss discharge needs. He has no preference for provider. CM called referral to [facility] with [facility]. CM contacted [facility] to establish patient for medical care and follow up, they have no appointments until August. [facility] Someone will contact him to arrange appointment within two weeks.    Final next level of care: Home w Home Health Services Barriers to Discharge: No Barriers Identified   Patient Goals and CMS Choice Patient states their goals for this hospitalization and ongoing recovery are:: go home CMS Medicare.gov Compare Post Acute Care list provided to:: Patient Choice offered to / list presented to : Patient  Discharge Placement                       Discharge Plan and Services     Post Acute Care Choice: Home Health                    HH Arranged: PT, OT [facility] Agency: [facility] Date [facility] Agency Contacted: 12/13/19 Time HH Agency Contacted: 1211 Representative spoke with at [facility] Agency: Lorenza Chick  Social Determinants of Health (SDOH) Interventions     Readmission Risk Interventions No flowsheet data found.

## 2019-12-13 NOTE — Evaluation (Signed)
Physical Therapy Evaluation Patient Details Name: Jeffrey Merritt MRN: [id number] DOB: 01-04-1981 Today's Date: 12/13/2019   History of Present Illness  39 yo found unresponsive at motel with esophageal intubation in field 7/8 and corrected ET intubation in ED. Extubated 7/13. Pt with polysubstance abuse, Aspiration PNA, cocaine OD. 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 Pt reports he is normally independent and has mom at home who can assist. Pt initially very slow to respond to questions and commands and difficult to understand with soft spoken voice. Pt very unsteady on his feet needing assist to prevent falls and even with RW required hands on support. Pt with decreased cognition, safety and gait who will benefit from acute therapy to maximize mobility and safety to decrease fall risk.      Follow Up Recommendations Home health PT;Supervision/Assistance - 24 hour    Equipment Recommendations  None recommended by PT    Recommendations for Other Services OT consult     Precautions / Restrictions Precautions Precautions: Fall      Mobility  Bed Mobility Overal bed mobility: Needs Assistance Bed Mobility: Supine to Sit     Supine to sit: Supervision     General bed mobility comments: increased time with cues to initiate and complete transition to EOB  Transfers Overall transfer level: Needs assistance   Transfers: Sit to/from Stand Sit to Stand: Min assist         General transfer comment: min assist for balance and stability to stand from bed and sit in chair x 2 trials  Ambulation/Gait Ambulation/Gait assistance: Min assist Gait Distance (Feet): 150 Feet Assistive device: None;Rolling walker (2 wheeled) Gait Pattern/deviations: Drifts right/left;Scissoring;Staggering right;Staggering left   Gait velocity interpretation: >2.62 ft/sec, indicative of community ambulatory General  Gait Details: Pt with unsteady gait with posterior lean, staggering, scissoring with min assist for balance with gait without assist device for 150'. Then used RW for 60' with improvement to minguard assist and cues for safety and direction  Stairs            Wheelchair Mobility    Modified Rankin (Stroke Patients Only)       Balance Overall balance assessment: Needs assistance   Sitting balance-Leahy Scale: Fair       Standing balance-Leahy Scale: Poor Standing balance comment: physical support for balance in standing     Tandem Stance - Right Leg: 1   Rhomberg - Eyes Opened: 3     High Level Balance Comments: pt with LOB with rhomberg and tandem stance with assist to maintain balance             Pertinent Vitals/Pain Pain Assessment: No/denies pain    Home Living Family/patient expects to be discharged to:: Private residence Living Arrangements: Parent Available Help at Discharge: Family;Available 24 hours/day Type of Home: House Home Access: Stairs to enter   [employer] of Steps: 3 Home Layout: One level Home Equipment: Environmental consultant - 2 wheels Additional Comments: lives at home with 5 yo mom and 6yo daughter Clearance Coots    Prior Function Level of Independence: Independent               Hand Dominance        Extremity/Trunk Assessment   Upper Extremity Assessment Upper Extremity Assessment: Generalized weakness    Lower Extremity Assessment Lower Extremity Assessment: Generalized weakness    Cervical / Trunk Assessment Cervical / Trunk Assessment: Normal  Communication   Communication: Expressive difficulties (soft spoken)  Cognition Arousal/Alertness: Awake/alert Behavior During Therapy: WFL for tasks assessed/performed Overall Cognitive Status: Impaired/Different from baseline Area of Impairment: Following commands;Memory;Attention;Problem solving;Safety/judgement                   Current Attention Level:  Selective Memory: Decreased short-term memory Following Commands: Follows one step commands consistently Safety/Judgement: Decreased awareness of deficits;Decreased awareness of safety     General Comments: pt very slow to respond to questions and name initially, soft spoken and difficult to understand after intubation. Pt with impaired balance and gait stating he will be fine once he has orange juice      General Comments      Exercises     Assessment/Plan    PT Assessment Patient needs continued PT services  PT Problem List Decreased strength;Decreased mobility;Decreased activity tolerance;Decreased balance;Decreased knowledge of use of DME;Decreased cognition       PT Treatment Interventions DME instruction;Therapeutic exercise;Gait training;Functional mobility training;Therapeutic activities;Patient/family education    PT Goals (Current goals can be found in the Care Plan section)  Acute Rehab PT Goals Patient Stated Goal: return home PT Goal Formulation: With patient Time For Goal Achievement: 12/27/19 Potential to Achieve Goals: Fair    Frequency Min 3X/week   Barriers to discharge Decreased caregiver support pt lives with 32 yo mom who states she will assist to the best of her ability and agreeable to pt returning home with use of RW that is present at home    Co-evaluation               AM-PAC PT "6 Clicks" Mobility  Outcome Measure Help needed turning from your back to your side while in a flat bed without using bedrails?: None Help needed moving from lying on your back to sitting on the side of a flat bed without using bedrails?: None Help needed moving to and from a bed to a chair (including a wheelchair)?: A Little Help needed standing up from a chair using your arms (e.g., wheelchair or bedside chair)?: A Little Help needed to walk in hospital room?: A Little Help needed climbing 3-5 steps with a railing? : A Lot 6 Click Score: 19    End of Session  Equipment Utilized During Treatment: Gait belt Activity Tolerance: Patient tolerated treatment well Patient left: in chair;with call bell/phone within reach;with chair alarm set Nurse Communication: Mobility status PT Visit Diagnosis: Other abnormalities of gait and mobility (R26.89);Difficulty in walking, not elsewhere classified (R26.2)    Time: 8937-3428 PT Time Calculation (min) (ACUTE ONLY): 28 min   Charges:   PT Evaluation $PT Eval Moderate Complexity: 1 Mod PT Treatments $Gait Training: 8-22 mins        Issabela Lesko P, PT Acute Rehabilitation Services Pager: [phone] Office: [phone]   Enedina Finner Jadiel Schmieder 12/13/2019, 12:48 PM

## 2019-12-18 LAB — MISC LABCORP TEST (SEND OUT): Labcorp test code: 764200

## 2020-09-08 ENCOUNTER — Emergency Department (HOSPITAL_COMMUNITY)
Admission: EM | Admit: 2020-09-08 | Discharge: 2020-09-08 | Disposition: A | Discharge status: Home / Self Care | Payer: Medicare Other | Attending: Emergency Medicine | Admitting: Emergency Medicine

## 2020-09-08 DIAGNOSIS — L089 Local infection of the skin and subcutaneous tissue, unspecified: Secondary | ICD-10-CM | POA: Diagnosis not present

## 2020-09-08 DIAGNOSIS — F17210 Nicotine dependence, cigarettes, uncomplicated: Secondary | ICD-10-CM | POA: Insufficient documentation

## 2020-09-08 DIAGNOSIS — R1030 Lower abdominal pain, unspecified: Secondary | ICD-10-CM | POA: Diagnosis not present

## 2020-09-08 MED ORDER — LIDOCAINE HCL (PF) 1 % IJ SOLN
INTRAMUSCULAR | Status: AC
  Administered 2020-09-08: 1 mL
  Filled 2020-09-08: qty 5

## 2020-09-08 MED ORDER — CEFTRIAXONE SODIUM 500 MG IJ SOLR
500.0000 mg | Freq: Once | INTRAMUSCULAR | Status: AC
  Administered 2020-09-08: 500 mg via INTRAMUSCULAR
  Filled 2020-09-08: qty 500

## 2020-09-08 MED ORDER — DOXYCYCLINE HYCLATE 100 MG PO CAPS
100.0000 mg | ORAL_CAPSULE | Freq: Two times a day (BID) | ORAL | 0 refills | Status: AC
Start: 2020-09-08 — End: 2020-09-18

## 2020-09-08 NOTE — ED Triage Notes (Signed)
"  About 2 weeks or so I was getting out of the shower and drying off down in my groin and I think I rubbed to hard underneath my groin and it is bleeding and hurts."

## 2020-09-08 NOTE — Discharge Instructions (Addendum)
You were evaluated in the Emergency Department and after careful evaluation, we did not find any emergent condition requiring admission or further testing in the hospital.  Your exam/testing today was overall reassuring.  Symptoms seem to be due to a wound infection.  Please take the doxycycline antibiotic as directed.  As discussed, use Tylenol, Motrin, over-the-counter sprays or gels for pain.  Please return to the Emergency Department if you experience any worsening of your condition.  Thank you for allowing [place] to be a part of your care.

## 2020-09-08 NOTE — ED Provider Notes (Signed)
[facility]EM[facility]ENCY DEPT [facility] Emergency Department Provider Note MRN:  [id number]  Arrival date & time: 09/08/20     Chief Complaint   Groin Pain   History of Present Illness   Jeffrey Merritt is a 40 y.o. year-old male with a history of schizophrenia presenting to the ED with chief complaint of groin pain.  Patient developed a sore perineum about 2 weeks ago, not explains that it began as a chafing injury while drying after shower.  Getting progressively worse.  Denies fever, no other complaints.  Denies any concern for STDs.  Monogamous relationship.  Review of Systems  A complete 10 system review of systems was obtained and all systems are negative except as noted in the HPI and PMH.   Patient's Health History    Past Medical History:  Diagnosis Date  . Schizophrenia (HCC)     No past surgical history on file.  Family History  Family history unknown: Yes    Social History   Socioeconomic History  . Marital status: Single    Spouse name: Not on file  . Number of children: Not on file  . Years of education: Not on file  . Highest education level: Not on file  Occupational History  . Not on file  Tobacco Use  . Smoking status: Current Every Day Smoker    Packs/day: 1.00    Types: Cigarettes  . Smokeless tobacco: Never Used  Vaping Use  . Vaping Use: Never used  Substance and Sexual Activity  . Alcohol use: No  . Drug use: Yes    Types: Marijuana  . Sexual activity: Not on file  Other Topics Concern  . Not on file  Social History Narrative  . Not on file   Social Determinants of Health   Financial Resource Strain: Not on file  Food Insecurity: Not on file  Transportation Needs: Not on file  Physical Activity: Not on file  Stress: Not on file  Social Connections: Not on file  Intimate Partner Violence: Not on file     Physical Exam   Vitals:   09/08/20 0350  BP: (!) 145/92  Pulse: 88  Resp: 16  Temp: 98.8 F (37.1 C)  SpO2: 100%     CONSTITUTIONAL: Well-appearing, NAD NEURO:  Alert and oriented x 3, no focal deficits EYES:  eyes equal and reactive ENT/NECK:  no LAD, no JVD CARDIO: Regular rate, well-perfused, normal S1 and S2 PULM:  CTAB no wheezing or rhonchi GI/GU:  normal bowel sounds, non-distended, non-tender; right inguinal lymphadenopathy; there is a 1 to 2 cm oval-shaped ulcerative lesion in the perineum and there also a few other smaller scattered ulcerative lesions surrounding this larger lesion.  Exquisitely tender to palpation, no fluctuance MSK/SPINE:  No gross deformities, no edema SKIN:  no rash, atraumatic PSYCH:  Appropriate speech and behavior  *Additional and/or pertinent findings included in MDM below  Diagnostic and Interventional Summary    EKG Interpretation  Date/Time:    Ventricular Rate:    PR Interval:    QRS Duration:   QT Interval:    QTC Calculation:   R Axis:     Text Interpretation:        Labs Reviewed - No data to display  No orders to display    Medications  cefTRIAXone (ROCEPHIN) injection 500 mg (has no administration in time range)     Procedures  /  Critical Care Procedures  ED Course and Medical Decision Making  I have reviewed the  triage vital signs, the nursing notes, and pertinent available records from the EMR.  Listed above are laboratory and imaging tests that I personally ordered, reviewed, and interpreted and then considered in my medical decision making (see below for details).  Exam seems most consistent with chancroid though patient denies concern for STDs.  Less likely syphilis given its tenderness.  Herpes also a possibility.  Also considering wound infection based on patient's history.  However patient is with normal vital signs, there does not appear to be any fluctuance or deep tissue involvement based on exam.  Will treat empirically for chancroid with intramuscular ceftriaxone and will use doxycycline to cover for soft tissue infection, or  other STDs.  Strict return precautions.       Elmer Sow. Pilar Plate, MD [facility] Emergency Medicine [facility] [email or web address]  Final Clinical Impressions(s) / ED Diagnoses     ICD-10-CM   1. Wound infection  T14.8XXA    L08.9     ED Discharge Orders         Ordered    doxycycline (VIBRAMYCIN) 100 MG capsule  2 times daily        09/08/20 0406           Discharge Instructions Discussed with and Provided to Patient:     Discharge Instructions     You were evaluated in the Emergency Department and after careful evaluation, we did not find any emergent condition requiring admission or further testing in the hospital.  Your exam/testing today was overall reassuring.  Symptoms seem to be due to a wound infection.  Please take the doxycycline antibiotic as directed.  As discussed, use Tylenol, Motrin, over-the-counter sprays or gels for pain.  Please return to the Emergency Department if you experience any worsening of your condition.  Thank you for allowing [place] to be a part of your care.       Sabas Sous, MD 09/08/20 [phone]

## 2020-09-08 NOTE — ED Notes (Signed)
Patient verbalized understanding of discharge instructions. Opportunity for questions and answers.  
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
## 2024-01-23 ENCOUNTER — Emergency Department (HOSPITAL_COMMUNITY)
Admission: EM | Admit: 2024-01-23 | Discharge: 2024-01-23 | Disposition: A | Discharge status: Home / Self Care | Attending: Emergency Medicine | Admitting: Emergency Medicine

## 2024-01-23 ENCOUNTER — Encounter (HOSPITAL_COMMUNITY): Payer: Self-pay | Admitting: Emergency Medicine

## 2024-01-23 ENCOUNTER — Other Ambulatory Visit: Payer: Self-pay

## 2024-01-23 DIAGNOSIS — N179 Acute kidney failure, unspecified: Secondary | ICD-10-CM | POA: Diagnosis not present

## 2024-01-23 DIAGNOSIS — F1910 Other psychoactive substance abuse, uncomplicated: Secondary | ICD-10-CM | POA: Diagnosis present

## 2024-01-23 LAB — CBC WITH DIFFERENTIAL/PLATELET
Abs Immature Granulocytes: 0.03 K/uL (ref 0.00–0.07)
Basophils Absolute: 0.1 K/uL (ref 0.0–0.1)
Basophils Relative: 0 %
Eosinophils Absolute: 0.2 K/uL (ref 0.0–0.5)
Eosinophils Relative: 1 %
HCT: 43.8 % (ref 39.0–52.0)
Hemoglobin: 15.2 g/dL (ref 13.0–17.0)
Immature Granulocytes: 0 %
Lymphocytes Relative: 22 %
Lymphs Abs: 3.2 K/uL (ref 0.7–4.0)
MCH: 28.3 pg (ref 26.0–34.0)
MCHC: 34.7 g/dL (ref 30.0–36.0)
MCV: 81.6 fL (ref 80.0–100.0)
Monocytes Absolute: 1.4 K/uL — ABNORMAL HIGH (ref 0.1–1.0)
Monocytes Relative: 9 %
Neutro Abs: 10 K/uL — ABNORMAL HIGH (ref 1.7–7.7)
Neutrophils Relative %: 68 %
Platelets: 319 K/uL (ref 150–400)
RBC: 5.37 MIL/uL (ref 4.22–5.81)
RDW: 12.5 % (ref 11.5–15.5)
WBC: 14.8 K/uL — ABNORMAL HIGH (ref 4.0–10.5)
nRBC: 0 % (ref 0.0–0.2)

## 2024-01-23 LAB — BASIC METABOLIC PANEL WITH GFR
Anion gap: 11 (ref 5–15)
BUN: 30 mg/dL — ABNORMAL HIGH (ref 6–20)
CO2: 26 mmol/L (ref 22–32)
Calcium: 9.9 mg/dL (ref 8.9–10.3)
Chloride: 101 mmol/L (ref 98–111)
Creatinine, Ser: 1.44 mg/dL — ABNORMAL HIGH (ref 0.61–1.24)
GFR, Estimated: >60 mL/min (ref >60)
Glucose, Bld: 105 mg/dL — ABNORMAL HIGH (ref 70–99)
Potassium: 3.9 mmol/L (ref 3.5–5.1)
Sodium: 138 mmol/L (ref 135–145)

## 2024-01-23 LAB — CK: Total CK: 8055 U/L — ABNORMAL HIGH (ref 49–397)

## 2024-01-23 MED ORDER — LACTATED RINGERS IV BOLUS
1000.0000 mL | Freq: Once | INTRAVENOUS | Status: AC
  Administered 2024-01-23: 1000 mL via INTRAVENOUS

## 2024-01-23 MED ORDER — DIAZEPAM 5 MG/ML IJ SOLN
10.0000 mg | Freq: Once | INTRAMUSCULAR | Status: AC
  Administered 2024-01-23: 10 mg via INTRAVENOUS
  Filled 2024-01-23: qty 2
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
